# Patient Record
Sex: Female | Born: 1960 | Race: White | Hispanic: No | Marital: Married | Smoking: Never smoker
Health system: Southern US, Academic
[De-identification: ages and names within clinical notes are randomized; demographics above are authoritative.]

## PROBLEM LIST (undated history)

## (undated) DIAGNOSIS — E039 Hypothyroidism, unspecified: Secondary | ICD-10-CM

## (undated) DIAGNOSIS — E782 Mixed hyperlipidemia: Secondary | ICD-10-CM

## (undated) DIAGNOSIS — I10 Essential (primary) hypertension: Secondary | ICD-10-CM

## (undated) DIAGNOSIS — J45909 Unspecified asthma, uncomplicated: Secondary | ICD-10-CM

## (undated) DIAGNOSIS — F411 Generalized anxiety disorder: Secondary | ICD-10-CM

## (undated) HISTORY — DX: Generalized anxiety disorder: F41.1

## (undated) HISTORY — DX: Mixed hyperlipidemia: E78.2

## (undated) HISTORY — DX: Hypothyroidism, unspecified: E03.9

## (undated) HISTORY — DX: Unspecified asthma, uncomplicated: J45.909

## (undated) HISTORY — PX: HX APPENDECTOMY: SHX54

## (undated) HISTORY — DX: Essential (primary) hypertension: I10

---

## 1983-08-01 HISTORY — PX: HX PARTIAL HYSTERECTOMY: SHX80

## 1988-10-19 ENCOUNTER — Emergency Department (HOSPITAL_COMMUNITY): Payer: Self-pay

## 1993-07-31 HISTORY — PX: HX GALL BLADDER SURGERY/CHOLE: SHX55

## 2015-04-30 HISTORY — PX: THYROID SURGERY: SHX805

## 2020-07-31 HISTORY — PX: LEG SURGERY: SHX1003

## 2021-08-23 ENCOUNTER — Other Ambulatory Visit: Payer: Self-pay

## 2021-09-22 ENCOUNTER — Other Ambulatory Visit: Payer: Self-pay

## 2021-10-18 ENCOUNTER — Emergency Department (EMERGENCY_DEPARTMENT_HOSPITAL): Payer: No Typology Code available for payment source

## 2021-10-18 ENCOUNTER — Encounter (HOSPITAL_BASED_OUTPATIENT_CLINIC_OR_DEPARTMENT_OTHER): Payer: Self-pay

## 2021-10-18 ENCOUNTER — Emergency Department
Admission: EM | Admit: 2021-10-18 | Discharge: 2021-10-18 | Disposition: A | Discharge status: Home / Self Care | Payer: No Typology Code available for payment source | Attending: Family | Admitting: Family

## 2021-10-18 ENCOUNTER — Encounter (RURAL_HEALTH_CENTER): Payer: Self-pay | Admitting: Family

## 2021-10-18 ENCOUNTER — Other Ambulatory Visit: Payer: Self-pay

## 2021-10-18 ENCOUNTER — Ambulatory Visit (RURAL_HEALTH_CENTER): Payer: No Typology Code available for payment source | Attending: Family | Admitting: Family

## 2021-10-18 VITALS — BP 140/86 | HR 70 | Temp 97.5°F | Resp 18 | Ht 64.0 in | Wt 236.1 lb

## 2021-10-18 DIAGNOSIS — R531 Weakness: Secondary | ICD-10-CM | POA: Insufficient documentation

## 2021-10-18 DIAGNOSIS — M5432 Sciatica, left side: Secondary | ICD-10-CM

## 2021-10-18 DIAGNOSIS — M5416 Radiculopathy, lumbar region: Secondary | ICD-10-CM | POA: Insufficient documentation

## 2021-10-18 DIAGNOSIS — M5442 Lumbago with sciatica, left side: Secondary | ICD-10-CM | POA: Insufficient documentation

## 2021-10-18 DIAGNOSIS — R29898 Other symptoms and signs involving the musculoskeletal system: Secondary | ICD-10-CM

## 2021-10-18 DIAGNOSIS — M545 Low back pain, unspecified: Secondary | ICD-10-CM

## 2021-10-18 DIAGNOSIS — R29890 Loss of height: Secondary | ICD-10-CM | POA: Insufficient documentation

## 2021-10-18 DIAGNOSIS — M5136 Other intervertebral disc degeneration, lumbar region: Secondary | ICD-10-CM | POA: Insufficient documentation

## 2021-10-18 DIAGNOSIS — M549 Dorsalgia, unspecified: Secondary | ICD-10-CM

## 2021-10-18 MED ORDER — MELOXICAM 7.5 MG TABLET
7.5000 mg | ORAL_TABLET | Freq: Every day | ORAL | 0 refills | Status: AC
Start: 2021-10-18 — End: 2021-11-01

## 2021-10-18 MED ORDER — KETOROLAC 60 MG/2 ML INTRAMUSCULAR SOLUTION
INTRAMUSCULAR | Status: AC
Start: 2021-10-18 — End: 2021-10-18
  Filled 2021-10-18: qty 2

## 2021-10-18 MED ORDER — KETOROLAC 60 MG/2 ML INTRAMUSCULAR SOLUTION
60.0000 mg | INTRAMUSCULAR | Status: AC
Start: 2021-10-18 — End: 2021-10-18
  Administered 2021-10-18: 60 mg via INTRAMUSCULAR

## 2021-10-18 MED ORDER — METHYLPREDNISOLONE 4 MG TABLETS IN A DOSE PACK
ORAL_TABLET | ORAL | 0 refills | Status: DC
Start: 2021-10-18 — End: 2023-09-04

## 2021-10-18 MED ORDER — DEXAMETHASONE SODIUM PHOSPHATE (PF) 10 MG/ML INJECTION SOLUTION
10.0000 mg | INTRAMUSCULAR | Status: AC
Start: 2021-10-18 — End: 2021-10-18
  Administered 2021-10-18: 10 mg via INTRAMUSCULAR

## 2021-10-18 MED ORDER — LIDOCAINE 4 % TOPICAL PATCH
1.0000 | MEDICATED_PATCH | CUTANEOUS | Status: DC
Start: 2021-10-18 — End: 2021-10-18
  Administered 2021-10-18: 1 via TRANSDERMAL

## 2021-10-18 MED ORDER — CYCLOBENZAPRINE 10 MG TABLET
10.0000 mg | ORAL_TABLET | Freq: Three times a day (TID) | ORAL | 0 refills | Status: DC | PRN
Start: 2021-10-18 — End: 2023-09-04

## 2021-10-18 MED ORDER — METHOCARBAMOL 750 MG TABLET
1500.0000 mg | ORAL_TABLET | Freq: Once | ORAL | Status: AC
Start: 2021-10-18 — End: 2021-10-18
  Administered 2021-10-18: 1500 mg via ORAL

## 2021-10-18 MED ORDER — DEXAMETHASONE SODIUM PHOSPHATE (PF) 10 MG/ML INJECTION SOLUTION
INTRAMUSCULAR | Status: AC
Start: 2021-10-18 — End: 2021-10-18
  Filled 2021-10-18: qty 1

## 2021-10-18 MED ORDER — METHOCARBAMOL 750 MG TABLET
ORAL_TABLET | ORAL | Status: AC
Start: 2021-10-18 — End: 2021-10-18
  Filled 2021-10-18: qty 2

## 2021-10-18 MED ORDER — LIDOCAINE 4 % TOPICAL PATCH
MEDICATED_PATCH | CUTANEOUS | Status: AC
Start: 2021-10-18 — End: 2021-10-18
  Filled 2021-10-18: qty 1

## 2021-10-18 NOTE — ED Nurses Note (Signed)
Lidocaine patch in still on patient at time of discharge.

## 2021-10-18 NOTE — ED Provider Notes (Signed)
[facility], [facility] Emergency Department  ED Primary Provider Note  History of Present Illness   Chief Complaint   Patient presents with   . Back Pain     Lower Back pain, radiating to left leg     Arrival: The patient arrived by Car    Jennifer Tapia is a 61 y.o. female who had concerns including Back Pain. lower back pain into left hip thigh knee. States hx of ddd. No trauma. Occurred after bending over mopping. States no bowel or bladder changes. No imaging in the past year   Review of Systems   Constitutional: No fever, chills or weakness   Skin: No rash or diaphoresis  HENT: No headaches, or congestion  Eyes: No vision changes or photophobia   Cardio: No chest pain, palpitations or leg swelling   Respiratory: No cough, wheezing or SOB  GI:  No nausea, vomiting or stool changes  GU:  No dysuria, hematuria, or increased frequency  MSK: No muscle aches, + joint + back pain  Neuro: No seizures, LOC, numbness, tingling, or focal weakness  Psychiatric: No depression, SI or substance abuse  All other systems reviewed and are negative.    Historical Data   History Reviewed This Encounter: all noted and reviewed.   Physical Exam   ED Triage Vitals [10/18/21 1244]   BP (Non-Invasive) (!) 142/79   Heart Rate 62   Respiratory Rate (!) 8   Temperature 36.4 C (97.5 F)   SpO2 96 %   Weight 106 kg (234 lb)   Height 1.626 m (5\' 4" )       Constitutional:  61 y.o. female who appears in no distress. Normal color, no cyanosis.   HENT:   Head: Normocephalic and atraumatic.   Mouth/Throat: Oropharynx is clear and moist.   Eyes: EOMI, PERRL   Neck: Trachea midline. Neck supple.  Cardiovascular: RRR, No murmurs, rubs or gallops. Intact distal pulses.  Pulmonary/Chest: BS equal bilaterally. No respiratory distress. No wheezes, rales or chest tenderness.   Abdominal: Bowel sounds present and normal. Abdomen soft, no tenderness, no rebound and no guarding.  Back: + lower lumbar midline spinal  tenderness, + left  paraspinal tenderness, no CVA tenderness.           Musculoskeletal: No edema, tenderness or deformity.  Skin: warm and dry. No rash, erythema, pallor or cyanosis  Psychiatric: normal mood and affect. Behavior is normal.   Neurological: Patient keenly alert and responsive, easily able to raise eyebrows, facial muscles/expressions symmetric, speaking in fluent sentences, moving all extremities equally and fully, normal gait  Patient Data   Labs Ordered/Reviewed - No data to display  XR LUMBOSACRAL SPINE   Final Result by Edi, Radresults In (03/21 1406)   The included distal thoracic vertebral bodies and the 5 lumbar vertebral bodies appear normal in height.   A decrease in height of some of the lower thoracic intervertebral disc spaces   Decrease in height of several of the lumbar intervertebral disc spaces slightly more, at L4/L5 and L5/S1.   Mild hypertrophic degenerative bony spurring at several levels.    Suggestion of degenerative facet arthropathy in the lower lumbar spine.               Radiologist location ID: [place] Decision Making           mdm - sciatica ddd. cauda equina syndrome considered no saddle numbness.  Medications Administered in the ED   lidocaine 4% patch (1 Patch Transdermal Patch Applied 10/18/21 1338)   dexamethasone (PF) 10 mg/mL injection (10 mg IntraMUSCULAR Given 10/18/21 1341)   methocarbamol (ROBAXIN) tablet (1,500 mg Oral Given 10/18/21 1337)   ketorolac (TORADOL) 60mg /2 mL IM injection (60 mg IntraMUSCULAR Given 10/18/21 1342)     Clinical Impression   Sciatica, left side (Primary)   Back pain, unspecified back location, unspecified back pain laterality, unspecified chronicity   DDD (degenerative disc disease), lumbar       Disposition: Discharged

## 2021-10-18 NOTE — ED Triage Notes (Signed)
Patient states that Sunday the back pain began after cleaning house, the radiating leg pain gradually began.

## 2021-10-18 NOTE — Progress Notes (Signed)
FAMILY MEDICINE, [facility] FAM[facility] MEDICINE [facility]  [address]  [place] [address]  Operated by [facility]     Name: Jennifer Tapia MRN:  [id number]   Date of Birth: Sep 21, 1960 Age: 61 y.o.   Date: 10/18/2021  Time: 12:21     Provider: Asa Lente, FNP    Reason for visit: Lower Back Pain      History of Present Illness:  Jennifer Tapia is a 61 y.o. female present to clinic today with c/o back pain. The initial pain started across the lower back, and across the upper back, just below the shoulder blades but over yesterday and today has progressed down the L leg to the knee. This started Sunday night after mopping the floor. She denies any issue with bowel or bladder incontinence. But has not had a BM since this started.   She tells me she been taking some ibuprofen intermittently and she has taken 1 of her husbands mobics. She not getting any relief from the medication. It is hard for her to ambulate d/t pain. She is also requiring a cane to steady her balance.   She tells me from time to time she has a flare up that resolves in 1-2 days. "but nothing like this"  She does use a Chiropractor for these flares. The chiropractor told her she has issues at L3-L4, she says that from a Xray she had in the past.  She tells me she substained a injury as a child to the area of the back that is hurting.   Her primary care provider is Dr Opal Sidles, he is currently out of town.    Historical Data    Past Medical History:  Past Medical History:   Diagnosis Date   . Asthma    . Generalized anxiety disorder    . HTN (hypertension)    . Hypothyroidism    . Mixed hyperlipidemia      Past Surgical History:   Past Surgical History:   Procedure Laterality Date   . HX APPENDECTOMY     . HX CESAREAN SECTION  1984   . HX CHOLECYSTECTOMY  1995   . HX PARTIAL HYSTERECTOMY  1985   . LEG SURGERY  2022    Cancer of right leg   . THYROID SURGERY  04/30/2015     Allergies:  Allergies   Allergen Reactions   .  Avelox [Moxifloxacin] Itching   . Doxycycline Nausea/ Vomiting     Medications:  Current Outpatient Medications   Medication Sig   . budesonide-glycopyr-formoterol (BREZTRI AEROSPHERE) 160-9-4.8 mcg/actuation Inhalation HFA Aerosol Inhaler Take 2 Puffs by inhalation Twice per day as needed   . FLUoxetine (PROZAC) 20 mg Oral Capsule Take 1 Capsule (20 mg total) by mouth Once a day   . fluticasone propionate (FLOVENT) 110 mcg/actuation Inhalation HFA Aerosol Inhaler oral inhaler Take 2 Puffs by inhalation Twice daily   . latanoprost (XALATAN) 0.005 % Ophthalmic Drops Instill 1 Drop into both eyes Every night   . levothyroxine (SYNTHROID) 88 mcg Oral Tablet Take 1 Tablet (88 mcg total) by mouth Once a day   . montelukast (SINGULAIR) 10 mg Oral Tablet Take 1 Tablet (10 mg total) by mouth Once a day   . rosuvastatin (CRESTOR) 10 mg Oral Tablet Take 1 Tablet (10 mg total) by mouth Every evening   . timoloL maleate (TIMOPTIC) 0.5 % Ophthalmic Drops Instill 1 Drop into both eyes Once a day   . triamterene-hydroCHLOROthiazide (DYAZIDE) 37.5-25  mg Oral Capsule Take 1 Capsule by mouth Once a day     Family History:  Family Medical History:     Problem Relation (Age of Onset)    Diabetes type II Mother    Heart Disease Mother    Hypertension (High Blood Pressure) Mother, Father    Lung Cancer Father    Stroke Father, Brother          Social History:  Social History     Socioeconomic History   . Marital status: Married     Spouse name: Jonny Ruiz   . Number of children: 2   . Years of education: 12   Tobacco Use   . Smoking status: Never   . Smokeless tobacco: Never   Vaping Use   . Vaping Use: Never used   Substance and Sexual Activity   . Alcohol use: Never   . Drug use: Never   . Sexual activity: Not Currently     Partners: Male     Social Determinants of Health     Financial Resource Strain: Low Risk    . SDOH Financial: No   Transportation Needs: Low Risk    . SDOH Transportation: No   Social Connections: Low Risk    . SDOH  Social Isolation: 5 or more times a week   Intimate Partner Violence: Low Risk    . SDOH Domestic Violence: No   Housing Stability: Low Risk    . SDOH Housing Situation: I have housing.   Marland Kitchen SDOH Housing Worry: No           Review of Systems:  Any pertinent Review of Systems as addressed in the HPI above.    Physical Exam:  Vital Signs:  Vitals:    10/18/21 1119   BP: (!) 140/86   Pulse: 70   Resp: 18   Temp: 36.4 C (97.5 F)   TempSrc: Temporal   SpO2: 96%   Weight: 107 kg (236 lb 2 oz)   Height: 1.626 m (5\' 4" )   BMI: 40.62     Physical Exam  Constitutional:       General: She is in acute distress.      Appearance: She is obese.   Cardiovascular:      Rate and Rhythm: Normal rate and regular rhythm.      Pulses: Normal pulses.   Pulmonary:      Effort: Pulmonary effort is normal.   Abdominal:      Palpations: Abdomen is soft.   Musculoskeletal:      Cervical back: Normal and neck supple.      Thoracic back: Spasms, tenderness and bony tenderness present. No swelling or edema. Decreased range of motion. Scoliosis present.      Lumbar back: Spasms, tenderness and bony tenderness present. Decreased range of motion. Positive right straight leg raise test and positive left straight leg raise test.      Right lower leg: Tenderness present.      Right foot: No foot drop.      Left foot: No foot drop.   Neurological:      Mental Status: She is alert and oriented to person, place, and time.      Sensory: Sensation is intact.      Motor: No weakness (R lower leg weakness).      Gait: Gait abnormal.      Deep Tendon Reflexes: Reflexes abnormal.      Reflex Scores:       Patellar  reflexes are 1+ on the right side and 2+ on the left side.       Achilles reflexes are 1+ on the right side and 2+ on the left side.         Assessment and Plan:    ICD-10-CM    1. Lumbar radiculopathy, acute  M54.16       2. Weakness of right leg  R29.898          It is quite obvious that [name] is having acute issues with Radiculopathy.  At this  time, due to the weakness of her R leg with reduced DTR, I believe it is in her best interest to go to the ER for further evaluation and scans. I have explained my findings and concerns to her and her husband who is with her. They have agreed to go.     Differential to r/o immediately Cauda equina syndrome.    Sekou Zuckerman L Berta Denson, FNP     Portions of this note may be dictated using voice recognition software or a dictation service. Variances in spelling and vocabulary are possible and unintentional. Not all errors are caught/corrected. Please notify the [facility] if any discrepancies are noted or if the meaning of any statement is not clear.

## 2021-10-18 NOTE — Nursing Note (Signed)
Patient here today with lower back pain. Patient states that she was mopping and sweeping on Sunday and that is when the pain started. Pain states that the pain is moving down to her legs and up into her shoulders.

## 2021-10-18 NOTE — Discharge Instructions (Signed)
Recommend mri and possible physical therapy.

## 2021-11-24 ENCOUNTER — Other Ambulatory Visit: Payer: Self-pay

## 2021-11-24 MED ORDER — LATANOPROST 0.005 % EYE DROPS
1.0000 [drp] | Freq: Every evening | OPHTHALMIC | 4 refills | Status: DC
Start: 2021-11-24 — End: 2023-09-04
  Filled 2021-11-24: qty 7.5, 75d supply, fill #0
  Filled 2022-02-13: qty 7.5, 75d supply, fill #1
  Filled 2022-04-13: qty 2.5, 25d supply, fill #2
  Filled 2022-04-17: qty 5, 50d supply, fill #2
  Filled 2022-06-19: qty 7.5, 75d supply, fill #3
  Filled 2022-07-14 – 2022-07-21 (×4): qty 7.5, 75d supply, fill #0
  Filled 2022-08-13: qty 2.5, 25d supply, fill #0
  Filled 2022-08-25: qty 5, 50d supply, fill #0
  Filled 2022-10-15 – 2022-11-01 (×2): qty 7.5, 75d supply, fill #1
  Filled ????-??-??: fill #0

## 2021-11-24 MED ORDER — TIMOLOL MALEATE 0.5 % ONCE DAILY EYE DROPS
1.0000 [drp] | Freq: Every morning | OPHTHALMIC | 4 refills | Status: DC
Start: 2021-11-24 — End: 2023-09-04
  Filled 2021-11-24: qty 7.5, 75d supply, fill #0
  Filled 2022-02-13 – 2022-04-13 (×2): qty 7.5, 75d supply, fill #1
  Filled 2022-06-19: qty 7.5, 75d supply, fill #2
  Filled 2022-07-14: qty 7.5, 75d supply, fill #0

## 2021-11-25 ENCOUNTER — Other Ambulatory Visit: Payer: Self-pay

## 2021-11-25 MED ORDER — TRIAMTERENE 37.5 MG-HYDROCHLOROTHIAZIDE 25 MG CAPSULE
ORAL_CAPSULE | ORAL | 5 refills | Status: DC
Start: 2021-11-25 — End: 2023-09-04
  Filled 2021-11-25: qty 90, 90d supply, fill #0
  Filled 2022-02-28: qty 90, 90d supply, fill #1
  Filled 2022-05-29: qty 90, 90d supply, fill #2
  Filled 2022-07-14 – 2022-08-16 (×2): qty 90, 90d supply, fill #0
  Filled 2022-11-21: qty 90, 90d supply, fill #1

## 2021-11-25 MED ORDER — FLUOXETINE 20 MG CAPSULE
ORAL_CAPSULE | ORAL | 5 refills | Status: DC
Start: 2021-11-25 — End: 2023-09-04
  Filled 2021-11-25: qty 90, 90d supply, fill #0
  Filled 2022-02-23: qty 90, 90d supply, fill #1
  Filled 2022-05-24: qty 90, 90d supply, fill #2
  Filled 2022-07-14 – 2022-08-16 (×2): qty 90, 90d supply, fill #0
  Filled 2022-11-21: qty 90, 90d supply, fill #1

## 2021-11-25 MED ORDER — LEVOTHYROXINE 88 MCG TABLET
ORAL_TABLET | ORAL | 5 refills | Status: DC
Start: 2021-11-25 — End: 2023-09-04
  Filled 2021-11-25 – 2021-11-30 (×2): qty 90, 90d supply, fill #0
  Filled 2022-02-28: qty 90, 90d supply, fill #1
  Filled 2022-05-29: qty 90, 90d supply, fill #2
  Filled 2022-07-14 – 2022-08-16 (×2): qty 90, 90d supply, fill #0
  Filled 2022-11-21: qty 90, 90d supply, fill #1

## 2021-11-25 MED ORDER — ROSUVASTATIN 20 MG TABLET
ORAL_TABLET | ORAL | 5 refills | Status: DC
Start: 2021-11-25 — End: 2021-12-06
  Filled 2021-11-25 – 2021-12-02 (×2): qty 90, 90d supply, fill #0

## 2021-11-25 MED ORDER — MONTELUKAST 10 MG TABLET
ORAL_TABLET | ORAL | 5 refills | Status: DC
Start: 2021-11-25 — End: 2023-09-04
  Filled 2021-11-25: qty 90, 90d supply, fill #0
  Filled 2022-02-23: qty 90, 90d supply, fill #1
  Filled 2022-05-24: qty 90, 90d supply, fill #2
  Filled 2022-07-14 – 2022-08-16 (×2): qty 90, 90d supply, fill #0
  Filled 2022-11-21: qty 90, 90d supply, fill #1

## 2021-11-25 MED ORDER — FLUTICASONE PROPIONATE 110 MCG/ACTUATION HFA AEROSOL INHALER
INHALATION_SPRAY | RESPIRATORY_TRACT | 5 refills | Status: DC
Start: 2021-11-25 — End: 2023-09-04
  Filled 2021-11-25 – 2022-11-21 (×3): qty 12, 30d supply, fill #0

## 2021-11-25 MED ORDER — FAMOTIDINE 40 MG TABLET
ORAL_TABLET | ORAL | 5 refills | Status: AC
Start: 2021-11-25 — End: ?
  Filled 2021-11-25: qty 90, 90d supply, fill #0
  Filled 2022-02-23: qty 90, 90d supply, fill #1
  Filled 2022-05-24: qty 90, 90d supply, fill #2
  Filled 2022-07-14 – 2022-08-16 (×2): qty 90, 90d supply, fill #0
  Filled 2022-11-21: qty 90, 90d supply, fill #1

## 2021-11-28 ENCOUNTER — Other Ambulatory Visit: Payer: Self-pay

## 2021-11-29 ENCOUNTER — Other Ambulatory Visit: Payer: Self-pay

## 2021-11-30 ENCOUNTER — Other Ambulatory Visit: Payer: Self-pay

## 2021-12-01 ENCOUNTER — Other Ambulatory Visit: Payer: Self-pay

## 2021-12-02 ENCOUNTER — Other Ambulatory Visit: Payer: Self-pay

## 2021-12-05 ENCOUNTER — Other Ambulatory Visit: Payer: Self-pay

## 2021-12-06 ENCOUNTER — Other Ambulatory Visit: Payer: Self-pay

## 2021-12-06 MED ORDER — ROSUVASTATIN 10 MG TABLET
10.0000 mg | ORAL_TABLET | Freq: Every day | ORAL | 5 refills | Status: DC
Start: 2021-12-06 — End: 2023-02-14
  Filled 2021-12-14 – 2022-02-10 (×4): qty 90, 90d supply, fill #0
  Filled 2022-05-17: qty 60, 60d supply, fill #1
  Filled 2022-05-19: qty 30, 30d supply, fill #1
  Filled 2022-07-14 – 2022-08-15 (×2): qty 90, 90d supply, fill #0
  Filled 2022-11-16: qty 90, 90d supply, fill #1

## 2021-12-14 ENCOUNTER — Other Ambulatory Visit: Payer: Self-pay

## 2021-12-28 ENCOUNTER — Other Ambulatory Visit: Payer: Self-pay

## 2021-12-29 ENCOUNTER — Other Ambulatory Visit: Payer: Self-pay

## 2021-12-30 ENCOUNTER — Other Ambulatory Visit: Payer: Self-pay

## 2021-12-30 MED ORDER — WEGOVY 0.25 MG/0.5 ML SUBCUTANEOUS PEN INJECTOR
0.2500 mg | PEN_INJECTOR | SUBCUTANEOUS | 5 refills | Status: DC
Start: 2021-12-30 — End: 2022-03-29
  Filled 2021-12-30: qty 2, 28d supply, fill #0

## 2022-01-03 ENCOUNTER — Other Ambulatory Visit: Payer: Self-pay

## 2022-01-06 ENCOUNTER — Other Ambulatory Visit: Payer: Self-pay

## 2022-01-11 ENCOUNTER — Other Ambulatory Visit: Payer: Self-pay

## 2022-01-13 ENCOUNTER — Other Ambulatory Visit: Payer: Self-pay

## 2022-01-17 ENCOUNTER — Other Ambulatory Visit: Payer: Self-pay

## 2022-01-23 ENCOUNTER — Encounter (INDEPENDENT_AMBULATORY_CARE_PROVIDER_SITE_OTHER): Payer: No Typology Code available for payment source | Admitting: Surgery

## 2022-02-09 ENCOUNTER — Other Ambulatory Visit: Payer: Self-pay

## 2022-02-10 ENCOUNTER — Other Ambulatory Visit: Payer: Self-pay

## 2022-02-13 ENCOUNTER — Other Ambulatory Visit: Payer: Self-pay

## 2022-02-20 ENCOUNTER — Other Ambulatory Visit: Payer: Self-pay

## 2022-02-20 ENCOUNTER — Encounter (INDEPENDENT_AMBULATORY_CARE_PROVIDER_SITE_OTHER): Payer: Self-pay | Admitting: Surgery

## 2022-02-21 ENCOUNTER — Other Ambulatory Visit: Payer: Self-pay

## 2022-02-28 ENCOUNTER — Other Ambulatory Visit: Payer: Self-pay

## 2022-03-03 ENCOUNTER — Other Ambulatory Visit: Payer: Self-pay

## 2022-03-29 ENCOUNTER — Encounter (INDEPENDENT_AMBULATORY_CARE_PROVIDER_SITE_OTHER): Payer: Self-pay | Admitting: Surgery

## 2022-03-29 ENCOUNTER — Other Ambulatory Visit: Payer: Self-pay

## 2022-03-29 ENCOUNTER — Ambulatory Visit (INDEPENDENT_AMBULATORY_CARE_PROVIDER_SITE_OTHER): Payer: No Typology Code available for payment source | Admitting: Surgery

## 2022-03-29 VITALS — BP 147/78 | HR 66 | Ht 64.0 in | Wt 234.0 lb

## 2022-03-29 DIAGNOSIS — L989 Disorder of the skin and subcutaneous tissue, unspecified: Secondary | ICD-10-CM

## 2022-03-29 NOTE — Progress Notes (Signed)
GENERAL SURGERY, [facility]  [address]  [place] [address]  Phone: [phone]  Fax: [phone]    Encounter Date: 03/29/2022    Patient ID:  Jennifer Tapia  JJO:[id number]    DOB: [birth year]  Age: 61 y.o. female    Subjective:     Chief Complaint   Patient presents with    Mole - Changing     Black mole on back it has been there a while and it is changing      HPI:   Patient claimed that she had lesion on the left upper back area that has changed is causing some itching and wanted to have it checked.  She has a history of melanoma that made her more concerned about it.    Current Outpatient Medications   Medication Sig    budesonide-glycopyr-formoterol (BREZTRI AEROSPHERE) 160-9-4.8 mcg/actuation Inhalation HFA Aerosol Inhaler Take 2 Puffs by inhalation Twice per day as needed    cyclobenzaprine (FLEXERIL) 10 mg Oral Tablet Take 1 Tablet (10 mg total) by mouth Three times a day as needed for Muscle spasms    famotidine (PEPCID) 40 mg Oral Tablet Take 1 tablet (40 mg) by mouth once daily    FLUoxetine (PROZAC) 20 mg Oral Capsule Take 1 Capsule (20 mg total) by mouth Once a day    FLUoxetine (PROZAC) 20 mg Oral Capsule Take 1 capsule (20mg ) by mouth once daily    fluticasone propionate (FLOVENT HFA) 110 mcg/actuation Inhalation HFA Aerosol Inhaler oral inhaler Inhale 2 puffs by mouth twice daily    fluticasone propionate (FLOVENT) 110 mcg/actuation Inhalation HFA Aerosol Inhaler oral inhaler Take 2 Puffs by inhalation Twice daily    latanoprost (XALATAN) 0.005 % Ophthalmic Drops Instill 1 Drop into both eyes Every night    latanoprost (XALATAN) 0.005 % Ophthalmic Drops Instill 1 Drop into both eyes Every evening    levothyroxine (SYNTHROID) 88 mcg Oral Tablet Take 1 Tablet (88 mcg total) by mouth Once a day    levothyroxine (SYNTHROID) 88 mcg Oral Tablet Take 1 tablet (88 mcg) by mouth once daily    Methylprednisolone (MEDROL DOSEPACK) 4 mg Oral Tablets, Dose Pack Take as instructed.     montelukast (SINGULAIR) 10 mg Oral Tablet Take 1 Tablet (10 mg total) by mouth Once a day    montelukast (SINGULAIR) 10 mg Oral Tablet Take 1 tablet (10 mg) by mouth every night at bedtime    rosuvastatin (CRESTOR) 10 mg Oral Tablet Take 1 Tablet (10 mg total) by mouth Once a day    semaglutide, weight loss, (WEGOVY) 0.25 mg/0.5 mL Subcutaneous Pen Injector Inject 0.5 mL (0.25 mg total) under the skin Every 7 days    timolol maleate (ISTALOL) 0.5 % Ophthalmic Drops, Once Daily Instill 1 Drop into both eyes Every morning    timoloL maleate (TIMOPTIC) 0.5 % Ophthalmic Drops Instill 1 Drop into both eyes Once a day    triamterene-hydroCHLOROthiazide (DYAZIDE) 37.5-25 mg Oral Capsule Take 1 Capsule by mouth Once a day    triamterene-hydroCHLOROthiazide (DYAZIDE) 37.5-25 mg Oral Capsule Take 1 capsule by mouth once daily     Allergies   Allergen Reactions    Doxycycline Nausea/ Vomiting    Amoxicillin      itching, rash    Lansoprazole      rash    Pantoprazole      rash    Sulfa (Sulfonamides)      rash    Avelox [Moxifloxacin] Itching     Past Medical  History:   Diagnosis Date    Asthma     Generalized anxiety disorder     HTN (hypertension)     Hypothyroidism     Mixed hyperlipidemia          Past Surgical History:   Procedure Laterality Date    HX APPENDECTOMY      HX CESAREAN SECTION  1984    HX CHOLECYSTECTOMY  1995    HX PARTIAL HYSTERECTOMY  1985    LEG SURGERY  2022    Cancer of right leg    THYROID SURGERY  04/30/2015         Family Medical History:       Problem Relation (Age of Onset)    Diabetes type II Mother    Heart Disease Mother    Hypertension (High Blood Pressure) Mother, Father    Lung Cancer Father    Stroke Father, Brother            Social History     Tobacco Use    Smoking status: Never    Smokeless tobacco: Never   Vaping Use    Vaping Use: Never used   Substance Use Topics    Alcohol use: Never    Drug use: Never       ROS:   Unremarkable  Objective:   Vitals: BP (!) 147/78 (Site: Left, Patient  Position: Sitting, Cuff Size: Adult)   Pulse 66   Ht 1.626 m (5\' 4" )   Wt 106 kg (234 lb)   SpO2 95%   BMI 40.17 kg/m         Physical Exam:  Left upper back in the scapular area has about 5 mm seborrheic keratosis type lesion and I did not see any suspicious lesions for malignancy.    Assessment & Plan:     ENCOUNTER DIAGNOSES     ICD-10-CM   1. Skin lesion of back  L98.9   I told the patient that this is benign lesion in my opinion however she should continue her appointment with a dermatologist on Friday for their opinion.    No orders of the defined types were placed in this encounter.      No follow-ups on file.    Wednesday, MD

## 2022-04-13 ENCOUNTER — Other Ambulatory Visit: Payer: Self-pay

## 2022-04-14 ENCOUNTER — Other Ambulatory Visit: Payer: Self-pay

## 2022-04-17 ENCOUNTER — Other Ambulatory Visit: Payer: Self-pay

## 2022-04-18 ENCOUNTER — Other Ambulatory Visit: Payer: Self-pay

## 2022-04-19 ENCOUNTER — Other Ambulatory Visit: Payer: Self-pay

## 2022-04-26 ENCOUNTER — Other Ambulatory Visit: Payer: Self-pay

## 2022-05-09 ENCOUNTER — Other Ambulatory Visit: Payer: Self-pay

## 2022-05-11 ENCOUNTER — Other Ambulatory Visit: Payer: Self-pay

## 2022-05-17 ENCOUNTER — Other Ambulatory Visit: Payer: Self-pay

## 2022-05-18 ENCOUNTER — Other Ambulatory Visit: Payer: Self-pay

## 2022-05-19 ENCOUNTER — Other Ambulatory Visit: Payer: Self-pay

## 2022-05-29 ENCOUNTER — Other Ambulatory Visit: Payer: Self-pay

## 2022-05-30 ENCOUNTER — Other Ambulatory Visit: Payer: Self-pay

## 2022-06-01 ENCOUNTER — Other Ambulatory Visit: Payer: Self-pay

## 2022-06-02 ENCOUNTER — Other Ambulatory Visit: Payer: Self-pay

## 2022-06-06 ENCOUNTER — Other Ambulatory Visit: Payer: Self-pay

## 2022-06-12 ENCOUNTER — Other Ambulatory Visit: Payer: Self-pay

## 2022-06-14 ENCOUNTER — Other Ambulatory Visit: Payer: Self-pay

## 2022-06-15 ENCOUNTER — Other Ambulatory Visit: Payer: Self-pay

## 2022-06-19 ENCOUNTER — Other Ambulatory Visit: Payer: Self-pay

## 2022-06-29 ENCOUNTER — Other Ambulatory Visit: Payer: Self-pay

## 2022-07-07 ENCOUNTER — Other Ambulatory Visit: Payer: Self-pay

## 2022-07-10 ENCOUNTER — Other Ambulatory Visit: Payer: Self-pay

## 2022-07-10 MED ORDER — LATANOPROST 0.005 % EYE DROPS
OPHTHALMIC | 0 refills | Status: DC
Start: 2022-07-07 — End: 2023-09-04
  Filled 2022-07-10 – 2022-08-28 (×4): qty 5, 50d supply, fill #0
  Filled 2022-08-29: qty 15, fill #0
  Filled 2022-11-14: qty 5, 50d supply, fill #0
  Filled 2022-12-24: qty 7.5, 75d supply, fill #0

## 2022-07-10 MED ORDER — TIMOLOL MALEATE 0.5 % EYE DROPS
OPHTHALMIC | 3 refills | Status: DC
Start: 2022-07-07 — End: 2023-09-04
  Filled 2022-07-10 – 2022-07-14 (×2): qty 5, 50d supply, fill #0
  Filled 2022-07-15: qty 5, 30d supply, fill #0
  Filled 2022-08-21: qty 5, 30d supply, fill #1
  Filled 2022-09-18: qty 5, 30d supply, fill #2
  Filled 2022-10-18: qty 5, 30d supply, fill #3

## 2022-07-11 ENCOUNTER — Other Ambulatory Visit: Payer: Self-pay

## 2022-07-13 ENCOUNTER — Other Ambulatory Visit: Payer: Self-pay

## 2022-07-14 ENCOUNTER — Other Ambulatory Visit: Payer: Self-pay

## 2022-07-15 ENCOUNTER — Other Ambulatory Visit: Payer: Self-pay

## 2022-07-17 ENCOUNTER — Other Ambulatory Visit: Payer: Self-pay

## 2022-07-19 ENCOUNTER — Other Ambulatory Visit: Payer: Self-pay

## 2022-07-20 ENCOUNTER — Other Ambulatory Visit: Payer: Self-pay

## 2022-07-21 ENCOUNTER — Other Ambulatory Visit: Payer: Self-pay

## 2022-07-25 ENCOUNTER — Other Ambulatory Visit: Payer: Self-pay

## 2022-07-26 ENCOUNTER — Other Ambulatory Visit: Payer: Self-pay

## 2022-07-27 ENCOUNTER — Other Ambulatory Visit: Payer: Self-pay

## 2022-08-11 ENCOUNTER — Encounter (HOSPITAL_COMMUNITY): Payer: Self-pay

## 2022-08-14 ENCOUNTER — Other Ambulatory Visit: Payer: Self-pay

## 2022-08-15 ENCOUNTER — Other Ambulatory Visit: Payer: Self-pay

## 2022-08-16 ENCOUNTER — Other Ambulatory Visit: Payer: Self-pay

## 2022-08-21 ENCOUNTER — Other Ambulatory Visit: Payer: Self-pay

## 2022-08-22 ENCOUNTER — Other Ambulatory Visit: Payer: Self-pay

## 2022-08-23 ENCOUNTER — Other Ambulatory Visit: Payer: Self-pay

## 2022-08-24 ENCOUNTER — Other Ambulatory Visit: Payer: Self-pay

## 2022-08-25 ENCOUNTER — Other Ambulatory Visit: Payer: Self-pay

## 2022-08-28 ENCOUNTER — Other Ambulatory Visit: Payer: Self-pay

## 2022-08-29 ENCOUNTER — Other Ambulatory Visit: Payer: Self-pay

## 2022-09-06 ENCOUNTER — Other Ambulatory Visit: Payer: Self-pay

## 2022-09-19 ENCOUNTER — Other Ambulatory Visit: Payer: Self-pay

## 2022-10-17 ENCOUNTER — Other Ambulatory Visit: Payer: Self-pay

## 2022-10-18 ENCOUNTER — Other Ambulatory Visit: Payer: Self-pay

## 2022-10-20 NOTE — Consults (Signed)
**Physician Signature**  This document was electronically signed by: Lauraine Rinne DO      **Consult Cover Page**  FROM: [place], [phone]  Call Back Number: [phone]  SUBJECT: Consult Recommendations  Date and Time of Report: 10/20/2022 06:32 PM ET  Items Contained in this Document: Neurology Consult Note    **Consult Information**  Member Facility: [facility] MRN: [id number]  Visit/Encounter Number: [id number]  Consult ID: [id number]  Facility Time Zone: ET  Date and Time of Request: 10-20-2022 05:11 PM ET  Requesting Clinician: patel,sheila  Patient Name: Jennifer Tapia, Jennifer Tapia  Date of Birth: 1960/09/16  Gender: Female  Patient identity was confirmed at the beginning of the consult with the patient/family/staff using two personal identifiers: Patient name and DOB    **Reason for Consult**  Reason for Consult: Code Stroke TLKW 4.5 to 24 hours    **General**  Chief Complaint: numbness  Patient Location and Admission Status: ED- Patient is not admitted  Family Members and Medical Staff Present During Exam: Husband. Santiago Glad RN  History of Present Illness: Ms Daulton said she started getting numbness and/or tingling on both sides of her chin. She is sure it was both sides. That was about 0900. It continued throughout the day. When she was coming home this evening it moved to both jaws. The left s de has resolved. She did have Bell's Palsy 30 years ago, but that was different. No numbness or tingling anywhere in her body. Her R UE became heavy around 1300. Weakness not due to joint/muscle pain. No pain in chin or jaw. No pain with chewing. No vision or hearing changes, other than vision "being fuzzy". No CP.  She has palpitations, but states that is standard for her. She does have dyspnea. No Abd pain, bowel or bladder concerns. No recent med changes. No recent illnesses. No fevers/chills. She denies any problems with anxiety. Currently it is mostly tingling in the right  chin, numbness in the right cheek and weakness in the right arm  Number of Documented HPI Elements: 4+    **Medical History**  Medical History: Hyperlipidemia, Hypertension, Hypothyroidism  Other Medical History: GERD. Glaucoma  Other Past Procedures: hemi thyroidectomy. melanoma excision. C-section x 2. Appy  Pertinent Family History: Stroke    **Allergies**  Other Allergies: avelox, doxy    **Medications**  Anti-Coagulants: None  Anti-Platelets: None  Other Medications: synthroid, crestor, flovent, latanoprost, prozac, singulair, traimeterne    **Social History**  Alcohol Use: None  Drug Use: None  Tobacco Use: None    **Vital Signs**  Temperature: Afebrile  Blood Pressure (mmHg): 135/74  Heart Rate (bpm): 65  Respiration Rate (/min): 18  O2 Sat (%): 96  POC Glucose(mg/dL): 121  Oxygen Delivery Method: Room Air  EKG Rhythm: Sinus Rhythm  Date and Time: 10/20/2022 06:06:04 PM  ET  POC Glucose(mg/dL): 121    **Review Of Systems**  General, Constitutional: See HPI  Neurological: See HPI  Psychiatric: See HPI  Cardiovascular: See HPI  Ears, Nose, Throat: See HPI  Respiratory: See HPI  Gastrointestinal: See HPI  Genitourinary: See HPI  Musculoskeletal: See HPI  Ophthalmology: See HPI    **NIH Stroke Scale**  NIH Stroke Scale Score: 0  1. Level of Consciousness: 0 : alert; keenly responsive.  1a. LOC Questions: 0 : Answers both questions correctly.   1b. LOC Commands: 0 : Performs both tasks correctly.  2. Best Gaze: 0 :  Normal.  3. Visual: 0 : No visual loss.  4. Facial Palsy: 0 : Normal symmetrical movements.  5a. Motor Left Arm: 0 : No drift; limb holds 90 (or 45) degrees for full 10 seconds.  5b. Motor Right Arm : 0 : No drift; limb holds 90 (or 45) degrees for full 10 seconds.  6a. Motor Left Leg: 0 :  No drift; leg holds 30-degree position for full 5 seconds.  6b. Motor Right Leg: 0 :  No drift; leg holds 30-degree position for full 5 seconds.  7. Limb Ataxia: 0 : Absent.  8. Sensory: 0 : Normal; no sensory  loss.  9. Best Language: 0 : No aphasia; normal.  10. Dysarthria: 0 : Normal.  11. Extinction and inattention (formerly Neglect) : 0 : No abnormality.  NIHSS Entry Time: 10/20/2022 06:19:55 PM  ET    **Exam**  Exam: -General: Obese 45 F in NAD    -MS: A&Ox4. Language fluent. Follows all simple commands    -CN: PERRL. VFF. EOMI. Facies w symmetric sensation and strength. Tongue midline. No dysarthria.    -Motor: No drift x 4 extremities    -Sensory: Symmetric to LT x 4 distal extremities. No extinction    -Coordination: Accurate FTN and HTS B/L  Clinician assisting with exam: Santiago Glad RN    **Labs and Imaging**  Labs available?: Yes  PT (Seconds): 10.5  INR: 1  PTT (Seconds): 27.5  Blood Glucose (mg/dL): 121  WBC (mcL): 9.5  HGB (g/dL): 13.5  HCT (%): 41.8  PLT (mcL): 295  Troponin (ng/mL): 6  Na (mEq/L): 144  K (mEq/L): 3.3  BUN (mg/dL): 17  Cr (mg/dL): 1.03  CT Brain Findings: No acute changes  Additional CT Image Types Reviewed: CTA  Other CT Findings : Per radiology report. No LVO or critical stenosis.  Please note, I was unable to review images myself. Images not viewable in EMR. Images not sent to Access system despite request. [facility] not responding to my request to contact [facility] radiology    **Assessment and Recommendations**  Assessment: Ms Macy presents after some bilateral lower facial sensory symptoms, which later changed into predominantly R facial and UE symptoms. The limited neurologic exam possible in this format is unremarkable with NIHSS of zero. However, she does reports ongoing facial sensory symptoms. The etiology is unclear. Bilaterally symmetric facial symptoms are unlikely to be a stroke, but left sided symptoms have resolved. She should be admitted for further evaluation. She is not a thrombolysis candidate based on time of onset and NIHSS is zero. It could be a migrainous phenomenon.  Recommendations: -Admit stroke/telemetry unit  -Permissive HTN to maximum SBP 220 and maximum DBP 110  (but do not lower precipitously)  -MRI Brain without contrast  -TTE w bubble  -ASA 325 mg x 1 now and then 81 mg daily   -Statin  -Lipid panel, HA1C, CBC, CMP, Coag, TSH, UA, B12, ESR, CRP  -NPO until bedside swallow  -PT, OT evals  -No triptans    This Teleneurology Consultation is provided in order to facilitate patient care in the absence of an immediately available in-person neurologist. The teleneurologist is not available on-site, does not give/write orders, and is not responsible for ongoing patient care/management. Any questions, orders, test results, or changes in patient status should be addressed directly with the on-site Physician of Record, and not the teleneurologist. However, please reconsult [facility] if we can be of any further assistance.  Disposition: Admit patient to telemetry or stroke unit    **Diagnosis**  Impression: Other  Diagnosis  Other: tingling, numbness  Case discussed with: Dr Posey Pronto    **Inclusion Criteria**  Time Last Known Well: 10-20-2022 09:00 AM ET  Neurological deficit considered to be disabling within 4.5 h of ischemic stroke symptom onset or patient last known well: No    **Thrombolysis Recommendation**  Thrombolysis recommended?: No  Reason Thrombolysis not recommended: Outside of window, Stroke severity too mild (non-disabling)    **ICD-10 Code**  ICD-10 Code (Primary): R20.2 : Paresthesia of skin  ICD-10 Code: R20.0 : Anesthesia of skin  ICD-10 Code: R29.700 : NIHSS score 0    **Attestation**  Interaction Mode: Video & Phone  Time of Phone Call : 10-20-2022 05:50 PM  ET  Time of Video Call : 10-20-2022 05:55 PM  ET  Interaction Attestation: Clinical telemedicine services delivered using HIPAA-compliant interactive video-audio telecommunications while the patient and the rendering provider were not in the same physical location. Written report was provided to the requesting provider.  Evaluation Duration (mins): 42    **Key Timer Summary**  ED Arrival Date and Time:  10-20-2022 02:21 PM  ET  Date and Time of Request: 10-20-2022 05:11 PM  ET    **Physician Signature**  This document was electronically signed by: Lauraine Rinne DO

## 2022-10-21 NOTE — H&P (Signed)
-------------------------------------------------------------------------------  Attestation signed by Fraser Din, MD at 10/21/2022  9:19 PM  This patient was seen and evaluated by Golden Circle, PA under my direct supervision and I was the [facility] Physician. This includes discussion of the plan of care for this patient, independent examination and a substantive portion of the visit completed by me, the physician. The patient's discharge evaluation, including the discharge exam and discharge plan, were reviewed and I agree with it. I personally made/approved the management plan and took responsibility for the patient management. The patient was personally seen by me and the substantive portion I performed was more than half of the [place].    -------------------------------------------------------------------------------    History & Physical  10/21/2022     PCP: Ellwood Dense     Chief Complaint:   Chief Complaint   Patient presents with   . STROKE ALERT   . Numbness     Chin and jaw   . Dizziness   . Headache   . Blurred Vision   . Arm Pain     Arm heaviness       History of Present Illness:  Jennifer Tapia is a 62 y.o. female with PMHX of asthma, HTN/ Hyperlipidemia, GERD, and Hypothyroidism who presents with a chief complaint of numb/tingling sensation.  Pt reports initial symptoms started in her lower extremities but quickly moved and changed more localized to her face, neck, and upper chest.  Reports she is unsure if they are related - and has hx of bells palsy - but that this was very different in nature.  She reports numbness and/or tingling on both sides of her chin and neck that continued throughout most of the day. Then later - when she was coming home this evening it moved to both jaws. Apparently by time of arrival the left sided facial numbness had improved. But the right was still there.  No numbness or tingling anywhere else in her body since it started in her face/  chin/neck but she began to feel weakness/ heaviness in her right arm.  No pain in chin or jaw. No pain with chewing. No vision or hearing changes, other than vision "being fuzzy". No CP , SOB, Abd pain, or bowel or bladder concerns. No recent medication  changes. No recent viral illnesses. No fevers/chills.     In the ED - VSS.  Basic labs eseeentially WNL except mild hypokalemia.  Head CT and other radiology studies without findings.  Tele Neuro consulted - appreciaete the assistance.  Pt admitted to observation to complete TIA work up and continued tx        Review of Systems  Review of Systems -  Constitutional: Negative for chills, fatigue and fever.   HENT: Negative for congestion, ear pain and sore throat.    Eyes: Negative for photophobia and visual disturbance.   Respiratory: Negative for shortness of breath, cough, and congestion.    Cardiovascular: Negative for chest pain and lpalpitations   Gastrointestinal: Negative for abdominal pain, diarrhea, nausea and vomiting.     Genitourinary: Negative for difficulty urinating, dysuria and flank pain.   Musculoskeletal: Positive heavy/ weak arm.  Negative for back pain and myalgias.   Skin: Negative for color change, pallor, rash and wound.   Neurological: Positive numbness and tingling in her face.  Negative for speech difficulty, seizures, strokes, syncope, and weakness.   Psychiatric/Behavioral: Negative.  Negative for self-injury and SI/HI.       The remainder of  the constitutional, HEENT, pulmonary, cardiovascular, genitourinary, GI, musculoskeletal, endocrine, neurologic and dermatologic systems are all negative.        Past Medical & Surgical History:   Past Medical History:   Diagnosis Date   . Asthma    . Essential hypertension, benign    . GERD (gastroesophageal reflux disease)    . High cholesterol    . Hypertension    . Hypothyroidism    . Melanoma [facility])        Past Surgical History:   Procedure Laterality Date   . EXPLORATORY OF ABDOMEN     . HX  APPENDECTOMY     . HX CESAREAN SECTION     . HX CHOLECYSTECTOMY     . HX HYSTERECTOMY     . THYROIDECTOMY         Allergies:  Allergies   Allergen Reactions   . Strawberries [Strawberry] Anaphylaxis   . Amoxil [Amoxicillin] Rash and Itching   . Doxycycline Nausea and Vomiting   . Avelox [Moxifloxacin] Rash   . Prevacid [Lansoprazole] Rash   . Protonix [Pantoprazole] Rash   . Sulfa (Sulfonamide Antibiotics) Rash       Medications:  Prior to Admission medications    Medication Sig Start Date End Date Taking? Authorizing Provider   albuterol (PROVENTIL HFA) 90 mcg/actuation HFA Aerosol Inhaler take 2 puffs every 4 hours as needed by inhalation .   Yes Emergency, Nurse, RN   levothyroxine (SYNTHROID) 88 mcg Tablet take 88 mcg every day by mouth .   Yes Emergency, Nurse, RN   latanoprost (XALATAN) 0.005 % Drops Apply 1 drop every evening each eye .   Yes Emergency, Nurse, RN   montelukast (SINGULAIR) 10 mg Tablet take 10 mg every day by mouth .   Yes Emergency, Nurse, RN   timoloL (BETIMOL) 0.5 % Drops Apply 1 drop every day each eye  use in affected eye(s).   Yes Emergency, Nurse, RN   FLUoxetine (PROZAC) 20 mg Capsule take 20 mg every day by mouth .   Yes Emergency, Nurse, RN   fluticasone propionate (FLOVENT) 110 mcg/actuation HFA Aerosol Inhaler take 2 puffs in the morning and 2 puffs before bedtime by inhalation.   Yes Emergency, Nurse, RN   triamterene-hydrochlorothiazide (DYAZIDE) 37.5-25 mg PO Cap take 1 Cap by mouth every day.    Yes Emergency, Nurse, RN   rosuvastatin (CRESTOR) 5 mg PO Tab take 10 mg by mouth every day.    Yes Emergency, Nurse, RN   albuterol (PROVENTIL) 2.5 mg /3 mL (0.083 %) Solution for Nebulization 2.5 mg three times daily as needed by Nebulization route  for Wheezing.  Patient not taking: Reported on 10/20/2022.    Emergency, Nurse, RN       Social History:  Social History     Tobacco Use   . Smoking status: Never   . Smokeless tobacco: Never   Substance Use Topics   . Alcohol use: No        Family History:  Family History   Problem Relation Name Age of Onset   . Hypertension Mother     . High Cholesterol Mother     . Diabetes Mother     . High Cholesterol Father     . Diabetes Father     . Stroke Father     . Cancer Father     . Diabetes Brother     . Stroke Brother         Physical  Exam    Constitutional: Oriented to person, place, and time. Appears frail an chronic I ill aalHead: Normocephalic and atraumatic.   Neck: Neck supple. Trachea midline.  No JVD  Cardiovascular: RRR  - No murmur heard.  Pulmonary/Chest: Effort normal and breath sounds normal. No respiratory distress. CTA  Abdominal: Obese. SNTND - normal BS. No masses. There is no rebound and no guarding.   Musculoskeletal: General: No edema. FROM  Neurological: Alert and oriented to person, place, and time. CN intact.  No focal des  Skin: Skin is warm and dry. Not diaphoretic.   Psychiatric: Normal mood and affect. Behavior is normal.     Nursing note and vitals reviewed.      Data Review:     CBC:   Lab Results   Component Value Date    WBC 9.5 10/20/2022    RBC 5.00 10/20/2022    HGB 13.5 10/20/2022    HCT 41.8 10/20/2022    PLT 295 10/20/2022     CBC w Diff:   Lab Results   Component Value Date    WBC 9.5 10/20/2022    HGB 13.5 10/20/2022    HCT 41.8 10/20/2022    PLT 295 10/20/2022    LYMPHOPCT 20.0 10/20/2022    MONOPCT 5.1 10/20/2022    EOSPCT 1.4 10/20/2022    BASOPCT 0.2 10/20/2022     BMP:   Lab Results   Component Value Date    GLU 121 (H) 10/20/2022    NA 144 10/20/2022    K 3.3 (L) 10/20/2022    CL 103 10/20/2022    CO2 33 (H) 10/20/2022    BUN 17 10/20/2022    CREATININE 1.03 (H) 10/20/2022    CA 9.7 10/20/2022     CMP:   Lab Results   Component Value Date    NA 144 10/20/2022    K 3.3 (L) 10/20/2022    CL 103 10/20/2022    CO2 33 (H) 10/20/2022    BUN 17 10/20/2022    CREATININE 1.03 (H) 10/20/2022    GLU 121 (H) 10/20/2022    PROT 7.8 10/20/2022    ALBUMIN 3.1 (L) 10/20/2022    CA 9.7 10/20/2022    BILITOT 0.4  10/20/2022    ALKPHOS 87 10/20/2022    AST 16 10/20/2022    ALT 23 10/20/2022    GLO 4.7 (H) 10/20/2022    MG 2.1 10/20/2022     Coagulation:   Lab Results   Component Value Date    PROTIME 10.5 10/20/2022    INR 1.0 10/20/2022    PTT 27.5 10/20/2022     Cardiac markers:   Lab Results   Component Value Date    TROPONINHS 6 10/20/2022     BNP:   Lab Results   Component Value Date    BNP 105.0 10/20/2022     Radiology review:   Chest X-Ray:   IMPRESSION:    No acute cardiopulmonary pathology.    Head CT  IMPRESSION: Normal intracranial examination.       CTA HEad and neck  IMPRESSION:  No hemodynamically significant stenosis within the head or neck. No evidence of intracranial aneurysm.    See       Old Chart reviewed    Assessment & Plan    TIA vs Atypical Migraine   Pt wit atypicl symptoms for both - tele neurology consulted from ED - see consult.  Work up negative to date.  Educated pt -= start  empiric daily ASA = focus on BP control = and PCP for lipids.  Already on a statin.  Labs and vitals otherwise stable.  Symptoms all resolved and pt feels better - back to sefl - without any acute issues or complaints.  Unable to get [facility] and MRI -  and complete work up as this is a weekend = also recommended by neurology was TEE bubble study.  Explained to pt.  She and husband seem very reliable.  Will allow to go home = educated on BP log, ;lipid check per PCP, follow up ECHO/TTE. MRI, and possible Neurology referral.  Pt understanding - does not wish to stay any longer = understanding of risks if was a TIA.  Educated on FAST.  Instructed to return to ED as needed      Tobacco Dependence:  Pt screened - not a current user.  Substance/ Alcohol Abuse:  Pt screened - not a current user.    DVT Prophylaxis: SCD's; medication monitoring of platelet count & H/H.  Stress Ulcer Prophylaxis: H2 Blocker    Advance Directive: CPR (ATTEMPT RESUSCITATION)    Care plan discussed with her; questions invited & answered.    The patient  is being admitted to the Hospitalist Service at [facility].       .    Physician/Midlevel Provider Discharge Summary  [facility]   Patient Name: Jennifer Tapia   Patient Age: 62 y.o.   Patient DOB: 1960-10-27   MRN: [id number]   PCP: Ellwood Dense   Admit date: 10/20/2022   Discharge date:  10/21/2022    Admitting Physician: Lestine Box, MD    Admission Diagnoses: Weakness due to acute cerebrovascular accident (CVA)  [facility]) [I63.9, R53.1]   Discharge Physician: Remus Blake, PA   Discharge Diagnoses:  Principal Problem:    Weakness due to acute cerebrovascular accident (CVA)       Consults:  none   Procedures: nonr   }    Pending Test Results     See chart - NEEDS SEVERAL OUTPT STUDIES PER PCP    Disposition     Patient discharged to: Home     Patient Instructions   Follow Up:  with  Ellwood Dense early next week for lab recheck - oupt orders for MRI, ECHO, and caroitds - lipids - and possible referral.  Pt educated on discharge meds nd instructed to retrurn to the ED for any new or worsening symptoms   Discharge Activity:  activity as tolerated     Discharge Diet:  Cardiac Diet and Low Fat, Low Cholesterol Diet     Special Instructions:  Call your primary care physician or return to the Emergency Department if you have new or worsening symptoms.  Call your primary care physician if you have any questions about or problems with your medications.   Discharge Medications:       Medication List      ASK your doctor about these medications    * albuterol 2.5 mg /3 mL (0.083 %) Nebu  Commonly known as: PROVENTIL  2.5 mg three times daily as needed by Nebulization route  for Wheezing.     * albuterol 90 mcg/actuation Hfaa  Commonly known as: PROVENTIL HFA  take 2 puffs every 4 hours as needed by inhalation .     FLUoxetine 20 mg Cap  Commonly known as: PROzac  take 20 mg every day by mouth .  fluticasone propionate 110 mcg/actuation Hfaa  Commonly known  as: FLOVENT  take 2 puffs in the morning and 2 puffs before bedtime by inhalation.     latanoprost 0.005 % Drop  Commonly known as: XALATAN  Apply 1 drop every evening each eye .     levothyroxine 88 mcg Tab  Commonly known as: SYNTHROID  take 88 mcg every day by mouth .     montelukast 10 mg Tab  Commonly known as: SINGULAIR  take 10 mg every day by mouth .     rosuvastatin 5 mg Tab  Commonly known as: CRESTOR  take 10 mg by mouth every day.     timoloL 0.5 % Drop  Commonly known as: BETIMOL  Apply 1 drop every day each eye  use in affected eye(s).     triamterene-hydrochlorothiazide 37.5-25 mg Cap  Commonly known as: DYAZIDE  take 1 Cap by mouth every day.         * This list has 2 medication(s) that are the same as other medications prescribed for you. Read the directions carefully, and ask your doctor or other care provider to review them with you.                 Total time in preparing paper work, data evaluation and todays exam 40 minutes  Seen and evaluated wotj Dr. Franchot Erichsen      Signed: Remus Blake, [place]  10/21/2022  10:29 AM

## 2022-10-21 NOTE — Progress Notes (Signed)
Printed discharge instructions, prescriptions, and follow up appointment information given to and reviewed with patient. Opportunity for question given and answers provided, patient verbalized understanding as well as the ability to comply with plan of care and repeated back the instructions given. Venous access removed intact, direct pressure applied, patient tolerated well. Patient was accompanied to the main lobby where transportation was waiting.

## 2022-10-23 ENCOUNTER — Other Ambulatory Visit: Payer: Self-pay

## 2022-10-24 ENCOUNTER — Other Ambulatory Visit (INDEPENDENT_AMBULATORY_CARE_PROVIDER_SITE_OTHER): Payer: Self-pay

## 2022-10-24 ENCOUNTER — Encounter (INDEPENDENT_AMBULATORY_CARE_PROVIDER_SITE_OTHER): Payer: Self-pay

## 2022-10-24 DIAGNOSIS — Z7189 Other specified counseling: Secondary | ICD-10-CM

## 2022-10-24 NOTE — Nursing Note (Signed)
Transition of Care Contact Information  Discharge Date: 10/21/2022  Transition Facility Type--Hospital (Inpatient or Observation)  [place]): Completed or attempted contact indicated by Date/Time  First Attempt Call: 10/24/2022  3:56 PM  Second Attempted Contact: 10/24/2022  3:56 PM  Contact Method(s)-- Patient/Caregiver Telephone, MyChart Patient Portal  Clinical Staff Name/Role who contacted--Jessie RN CTC  Transition Note:CTC attempted outreach via telephone for hospital discharge follow up with no avail. Voice mail left identifying myself as well as reason for call and my contact information. A MyChart message was also sent to patient with reasoning for outreach along with my contact information and the 24 hour nurse triage line telephone number. Final attempt for outreach will be completed on the next business day.      Unable to Complete TCM due to:  Further information may be documented in relevant telephone or outreach encounter.

## 2022-10-26 ENCOUNTER — Other Ambulatory Visit: Payer: Self-pay

## 2022-10-26 NOTE — Nursing Note (Signed)
Transition of Care Contact Information  Discharge Date: 10/21/2022  Transition Facility Type--Hospital (Inpatient or Observation)  [facility]): Completed or attempted contact indicated by Date/Time  First Attempt Call: 10/24/2022  3:56 PM  Second Attempted Contact: 10/24/2022  3:56 PM  Third Attempted Contact: 10/26/2022 12:32 PM  Contact Method(s)-- Patient/Caregiver Telephone, MyChart Patient Portal  Clinical Staff Name/Role who contacted--Jessie RN CTC  Transition Note:CTC attempted outreach via telephone for hospital discharge follow up with no avail. Voice mail left identifying myself as well as reason for call and my contact information. A MyChart message was also sent to patient with reasoning for outreach along with my contact information and the 24 hour nurse triage line telephone number. Final attempt for outreach will be completed within the next 1-2 business days.     10/26/22 12:32pm CTC attempted final outreach with no success. TOC closed as pt is unengaged / unable to reach.     Unable to Complete TCM due to:  Further information may be documented in relevant telephone or outreach encounter.

## 2022-10-30 ENCOUNTER — Other Ambulatory Visit: Payer: Self-pay

## 2022-10-30 ENCOUNTER — Encounter (INDEPENDENT_AMBULATORY_CARE_PROVIDER_SITE_OTHER): Payer: Self-pay

## 2022-10-31 ENCOUNTER — Other Ambulatory Visit: Payer: Self-pay

## 2022-11-01 ENCOUNTER — Other Ambulatory Visit: Payer: Self-pay

## 2022-11-02 ENCOUNTER — Other Ambulatory Visit: Payer: Self-pay

## 2022-11-07 ENCOUNTER — Other Ambulatory Visit (INDEPENDENT_AMBULATORY_CARE_PROVIDER_SITE_OTHER): Payer: Self-pay | Admitting: NURSE PRACTITIONER

## 2022-11-07 MED ORDER — AZITHROMYCIN 250 MG TABLET
ORAL_TABLET | ORAL | 0 refills | Status: DC
Start: 2022-11-07 — End: 2023-09-04

## 2022-11-14 ENCOUNTER — Other Ambulatory Visit: Payer: Self-pay

## 2022-11-16 ENCOUNTER — Other Ambulatory Visit: Payer: Self-pay

## 2022-11-21 ENCOUNTER — Other Ambulatory Visit: Payer: Self-pay

## 2022-11-22 ENCOUNTER — Other Ambulatory Visit: Payer: Self-pay

## 2022-12-26 ENCOUNTER — Other Ambulatory Visit: Payer: Self-pay

## 2022-12-26 IMAGING — MR MRI LUMBAR SPINE WITHOUT CONTRAST
6 series · 48 of 48 positions shown · IV contrast (gadolinium)
Comparison: None available.

﻿EXAM:  20233   MRI LUMBAR SPINE WITHOUT CONTRAST
INDICATION: 61-year-old with chronic low back pain radiating to both legs.  No history of back surgery.
TECHNIQUE: Multiplanar multisequential MRI of the lumbosacral spine was performed without gadolinium contrast.

[Series 4: T2 · coronal · 4.0mm · 1.61mm/px · 9 of 20 slices shown (1 of 3)]
[im 1/20]
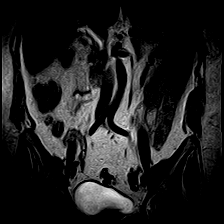
[im 3/20]
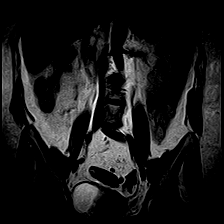
[im 5/20]
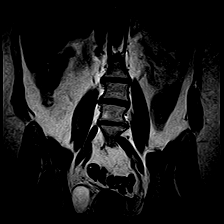
[im 8/20]
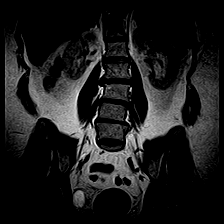
[im 10/20]
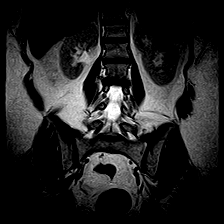
[im 12/20]
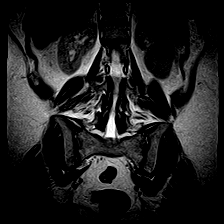
[im 15/20]
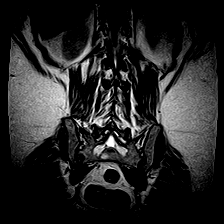
[im 17/20]
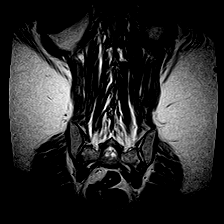
[im 20/20]
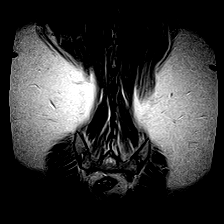

[Series 5: T2 · sagittal · 5.0mm · 1.00mm/px · 7 of 15 slices shown (2 of 3)]
[im 1/15]
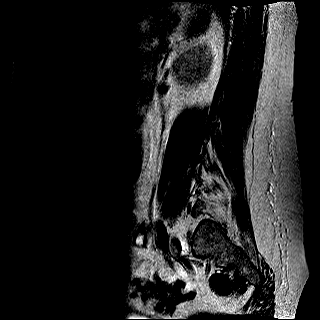
[im 3/15]
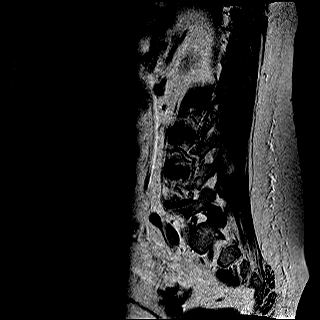
[im 5/15]
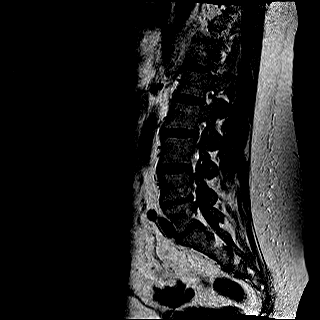
[im 8/15]
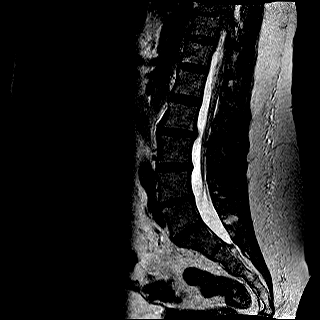
[im 10/15]
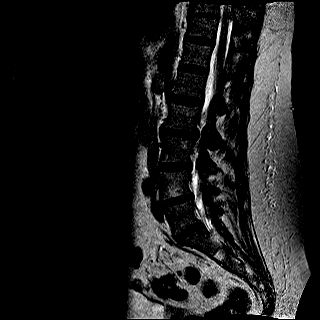
[im 12/15]
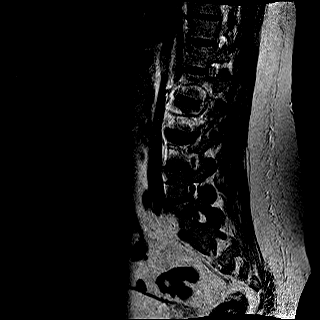
[im 15/15]
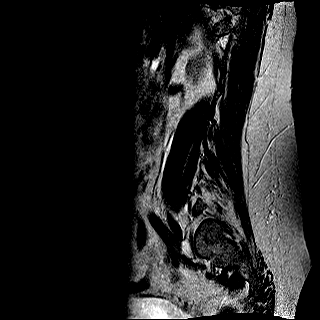

[Series 6: T1 · sagittal · 5.0mm · 1.00mm/px · 6 of 15 slices shown (1 of 2)]
[im 1/15]
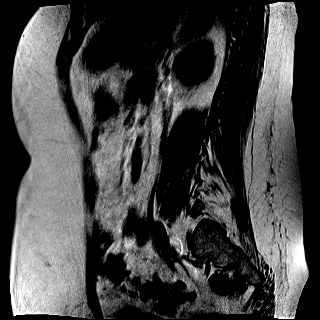
[im 3/15]
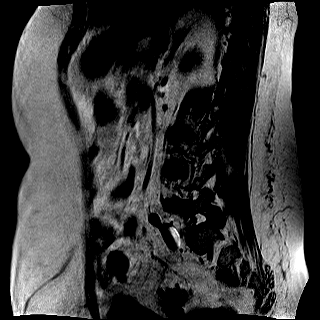
[im 6/15]
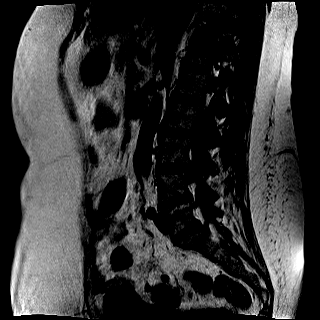
[im 9/15]
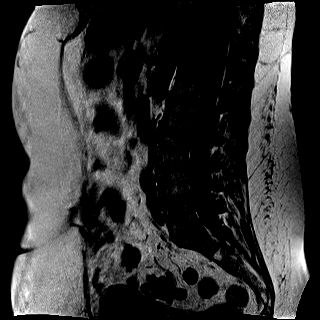
[im 12/15]
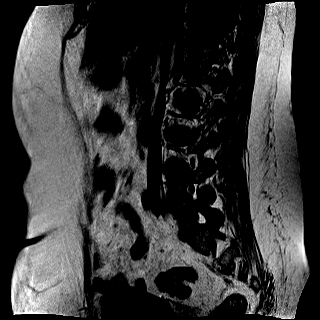
[im 15/15]
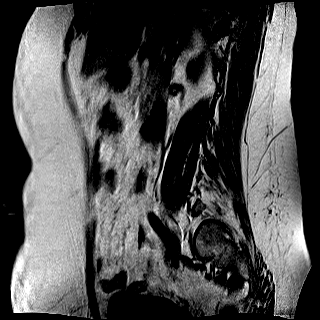

[Series 7: STIR · sagittal · 5.0mm · 1.25mm/px · 6 of 15 slices shown]
[im 1/15]
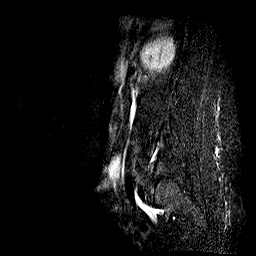
[im 3/15]
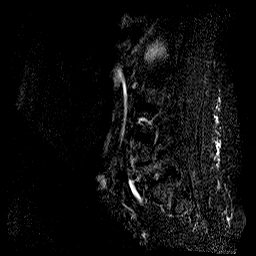
[im 6/15]
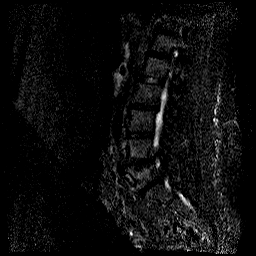
[im 9/15]
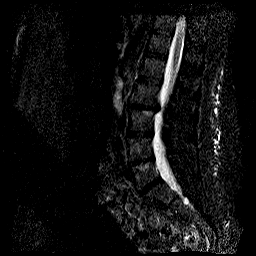
[im 12/15]
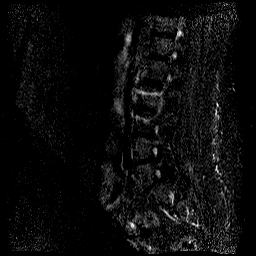
[im 15/15]
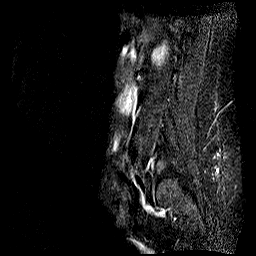

[Series 8: T2 · axial · 5.0mm · 0.89mm/px · z∈[-118,+85]mm · 10 of 25 slices shown (3 of 3)]
[im 1/25]
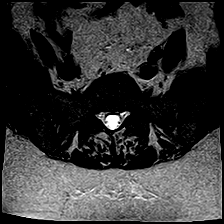
[im 3/25]
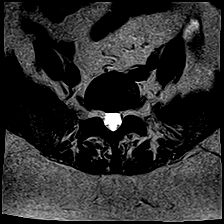
[im 6/25]
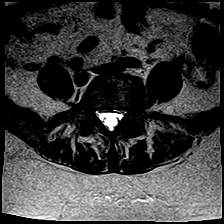
[im 9/25]
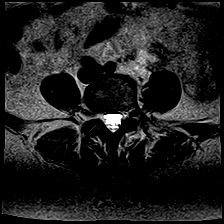
[im 11/25]
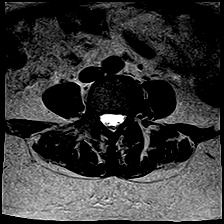
[im 14/25]
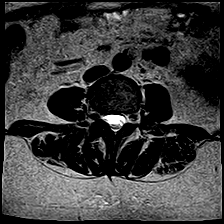
[im 17/25]
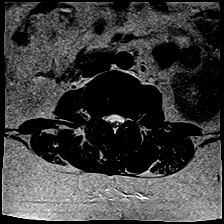
[im 19/25]
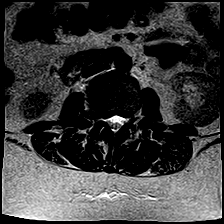
[im 22/25]
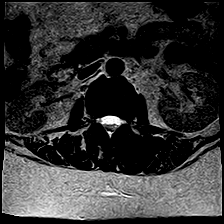
[im 25/25]
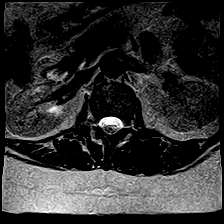

[Series 9: T1 · axial · 5.0mm · 0.89mm/px · z∈[-118,+85]mm · 10 of 25 slices shown (2 of 2)]
[im 1/25]
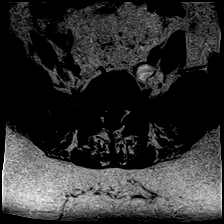
[im 3/25]
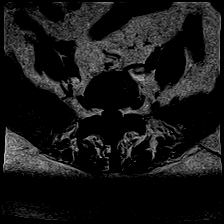
[im 6/25]
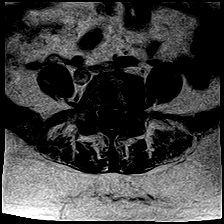
[im 9/25]
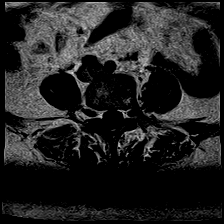
[im 11/25]
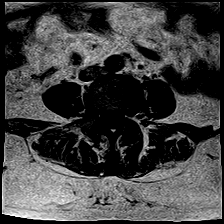
[im 14/25]
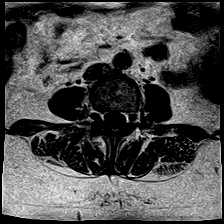
[im 17/25]
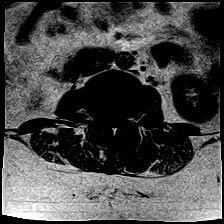
[im 19/25]
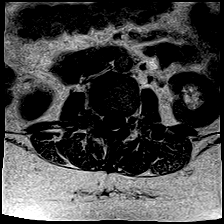
[im 22/25]
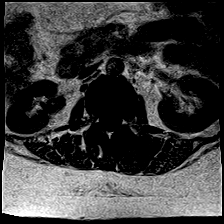
[im 25/25]
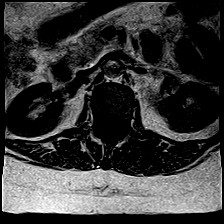

[48 of 48 positions shown; findings below may reference images not displayed]

FINDINGS: No acute or focal bone changes of lumbar vertebrae are seen.  Conus medullaris and cauda equina structures are normal in the sagittal projection.

At L1-2 level, no significant focal disc lesions are seen. 

At L2-3 level, significant degenerative disc disease with asymmetrically bulging annulus and osteophyte complex on the right side is causing significant compromise of right lateral recess and neural foramina.  Mild compromise of thecal sac and left lateral recess are noted.  AP diameter of thecal sac in the midline measures 12 mm.

At L3-4 level, degenerative disc disease and facet arthropathy are causing moderate compromise of right lateral recess and lesser degree of compromise of left lateral recess.

At L4-5 level, degenerative disc disease and facet arthropathy are causing mild compromise of both neural foramina. 

At L5-S1 level, no significant focal disc lesions are seen. 

Paravertebral soft tissues are unremarkable.
IMPRESSION: 1. No acute bony lesions of lumbar vertebrae.

2. At L2-3 level, significant degenerative disc disease with asymmetrically bulging annulus and osteophyte complex on the right side is causing significant compromise of right lateral recess and neural foramina.  Mild compromise of thecal sac and left lateral recess are noted.  AP diameter of thecal sac in the midline measures 12 mm.

3. Findings at other disc levels are described above in detail.

## 2022-12-27 ENCOUNTER — Other Ambulatory Visit: Payer: Self-pay

## 2022-12-28 ENCOUNTER — Other Ambulatory Visit: Payer: Self-pay

## 2022-12-28 MED ORDER — WEGOVY 0.25 MG/0.5 ML SUBCUTANEOUS PEN INJECTOR
PEN_INJECTOR | SUBCUTANEOUS | 5 refills | Status: AC
Start: 2022-12-27 — End: ?
  Filled 2022-12-28: qty 2, 28d supply, fill #0
  Filled 2023-02-19: qty 2, 56d supply, fill #1
  Filled 2023-03-01: qty 2, 28d supply, fill #1
  Filled 2023-03-22 – 2023-04-02 (×3): qty 2, 28d supply, fill #2
  Filled 2023-04-26 – 2023-05-04 (×3): qty 2, 28d supply, fill #3
  Filled 2023-05-24 – 2023-06-15 (×4): qty 2, 28d supply, fill #4

## 2022-12-29 ENCOUNTER — Other Ambulatory Visit: Payer: Self-pay

## 2023-01-04 ENCOUNTER — Other Ambulatory Visit: Payer: Self-pay

## 2023-01-09 ENCOUNTER — Other Ambulatory Visit: Payer: Self-pay

## 2023-01-10 ENCOUNTER — Other Ambulatory Visit: Payer: Self-pay

## 2023-01-11 ENCOUNTER — Other Ambulatory Visit: Payer: Self-pay

## 2023-01-11 MED ORDER — FLUTICASONE PROPIONATE 110 MCG/ACTUATION HFA AEROSOL INHALER
INHALATION_SPRAY | RESPIRATORY_TRACT | 5 refills | Status: DC
Start: 2023-01-11 — End: 2023-09-04
  Filled 2023-01-11: qty 12, 30d supply, fill #0
  Filled 2023-01-24: qty 12, 28d supply, fill #0

## 2023-01-12 ENCOUNTER — Other Ambulatory Visit: Payer: Self-pay

## 2023-01-15 ENCOUNTER — Other Ambulatory Visit: Payer: Self-pay

## 2023-01-15 ENCOUNTER — Other Ambulatory Visit (INDEPENDENT_AMBULATORY_CARE_PROVIDER_SITE_OTHER): Payer: Self-pay | Admitting: NURSE PRACTITIONER

## 2023-01-15 MED ORDER — SILVER SULFADIAZINE 1 % TOPICAL CREAM
TOPICAL_CREAM | Freq: Every day | CUTANEOUS | 1 refills | Status: AC
Start: 2023-01-15 — End: 2023-01-29

## 2023-01-16 ENCOUNTER — Other Ambulatory Visit: Payer: Self-pay

## 2023-01-16 MED ORDER — FLUTICASONE PROPIONATE 110 MCG/ACTUATION HFA AEROSOL INHALER
2.0000 | INHALATION_SPRAY | Freq: Two times a day (BID) | RESPIRATORY_TRACT | 5 refills | Status: AC
Start: 2023-01-16 — End: ?
  Filled 2023-01-16 – 2023-01-18 (×2): qty 12, 30d supply, fill #0

## 2023-01-17 ENCOUNTER — Other Ambulatory Visit: Payer: Self-pay

## 2023-01-17 MED ORDER — ARNUITY ELLIPTA 200 MCG/ACTUATION POWDER FOR INHALATION
DISK | RESPIRATORY_TRACT | 5 refills | Status: DC
Start: 2023-01-17 — End: 2023-09-04
  Filled 2023-01-17: qty 30, 30d supply, fill #0
  Filled 2023-03-15 – 2023-04-16 (×5): qty 30, 30d supply, fill #1
  Filled 2023-05-21: qty 30, 30d supply, fill #2
  Filled 2023-06-19 – 2023-06-20 (×3): qty 30, 30d supply, fill #3

## 2023-01-18 ENCOUNTER — Other Ambulatory Visit: Payer: Self-pay

## 2023-01-19 ENCOUNTER — Other Ambulatory Visit: Payer: Self-pay

## 2023-01-22 ENCOUNTER — Other Ambulatory Visit: Payer: Self-pay

## 2023-01-24 ENCOUNTER — Other Ambulatory Visit: Payer: Self-pay

## 2023-01-30 ENCOUNTER — Ambulatory Visit: Payer: Self-pay

## 2023-01-30 NOTE — Nursing Note (Signed)
PEAK CARE MANAGEMENT    Received warm transfer from member services.      Member states that she has always had back problems, specifically with her L3-5, T1-2, and S1.  Member has been following with her PCP and a chiropractor.  She has an appointment with her PCP later today.  Her PCP previously placed a referral for Neurology but member states that she could not get an appointment until February of next year.  Member states that the pain was tolerable last week.  She went to the chiropractor on Monday, Wednesday, and Thursday of last week.  After her appointment on Thursday with the chiropractor, she has been in a significant amount of pain.  She has almost fallen three times, she is crying in pain, missed work last week, is ambulating with a cane, and has numbness in her right leg.  The pain is affecting her ADLs and her work.      Chart review completed.  Confirmed that Neurology appointment is not scheduled until 09/04/2023.  Member has met TMOOP for benefit year 2024.    Provided member with a list of enhanced and standard Neurologists within a 25 mile radius of member's zip code and advised that since TMOOP for 2024 has been met, there is no difference in out of pocket cost of enhanced vs standard.  The TMOOP will reset 08/01/2023 and standard network providers will have a higher out of pocket cost.    I also advised member to discuss a pain management referral with her PCP at today's scheduled visit.  Member verbalized understanding.  Provided member with a list of in network pain management providers within a 50 mile radius of member's home zip code.    Home safety education discussed.    Educated member on when to seek immediate medical attention.    Provided member with direct contact information and encouraged to call with any questions, concerns, or needs.    Creta Levin, RN

## 2023-02-08 ENCOUNTER — Ambulatory Visit: Payer: Self-pay

## 2023-02-08 NOTE — Nursing Note (Signed)
PEAK CARE MANAGEMENT    Received call from member.      Member is still on short term disability and sees her PCP tomorrow.  She is in a significant amount of back pain which is affecting her ability to complete ADLs in a timely routine.    Member states that she did find a neurosurgeon to see her 03/04/24 - Dr. Marzella Schlein at [facility].  She is also going to be starting physical therapy on Monday at [facility].    After further research, notified member that Dr. Marzella Schlein and physical therapy at [facility] are out of network.    Provided member with number to [facility] neurosurgery department which is still about an hour and a half away from her home but they are in standard network.  She has tried all other in network (enhanced and standard) in her area and the soonest appointment she can get is Feb 2025.    Member is going to call [facility] on 02/08/23 to see if she can get a sooner appointment.  If she cannot, member is going to fill out a benefit exception form and submit to HR for Dr. Marzella Schlein.    Also provided member with several different in network physical therapy options and she is going to call them to get scheduled.    Confirmed member has direct contact information and encouraged to call with any questions, concerns, or needs.    Jennifer Levin, RN

## 2023-02-14 ENCOUNTER — Other Ambulatory Visit: Payer: Self-pay

## 2023-02-14 MED ORDER — ROSUVASTATIN 10 MG TABLET
ORAL_TABLET | ORAL | 5 refills | Status: DC
Start: 2023-02-14 — End: 2024-05-06
  Filled 2023-02-14: qty 90, 90d supply, fill #0
  Filled 2023-05-14 – 2023-06-14 (×4): qty 90, 90d supply, fill #1
  Filled 2023-09-13: qty 90, 90d supply, fill #2
  Filled 2024-01-09 – 2024-02-13 (×2): qty 90, 90d supply, fill #3

## 2023-02-15 ENCOUNTER — Other Ambulatory Visit: Payer: Self-pay

## 2023-02-19 ENCOUNTER — Other Ambulatory Visit: Payer: Self-pay

## 2023-02-20 ENCOUNTER — Other Ambulatory Visit: Payer: Self-pay

## 2023-02-21 ENCOUNTER — Other Ambulatory Visit: Payer: Self-pay

## 2023-02-21 MED ORDER — MONTELUKAST 10 MG TABLET
ORAL_TABLET | ORAL | 5 refills | Status: DC
Start: 2023-02-20 — End: 2024-03-05
  Filled 2023-02-21: qty 90, 90d supply, fill #0
  Filled 2023-05-21 – 2023-06-14 (×3): qty 90, 90d supply, fill #1
  Filled 2023-09-05: qty 90, 90d supply, fill #2
  Filled 2023-12-06: qty 90, 90d supply, fill #3

## 2023-02-21 MED ORDER — FLUOXETINE 20 MG CAPSULE
ORAL_CAPSULE | ORAL | 5 refills | Status: DC
Start: 2023-02-20 — End: 2024-03-05
  Filled 2023-02-21: qty 90, 90d supply, fill #0
  Filled 2023-05-21 – 2023-06-14 (×3): qty 90, 90d supply, fill #1
  Filled 2023-09-05: qty 90, 90d supply, fill #2
  Filled 2023-12-06: qty 90, 90d supply, fill #3

## 2023-02-21 MED ORDER — TRIAMTERENE 37.5 MG-HYDROCHLOROTHIAZIDE 25 MG CAPSULE
ORAL_CAPSULE | ORAL | 5 refills | Status: DC
Start: 2023-02-20 — End: 2024-05-06
  Filled 2023-02-21: qty 90, 90d supply, fill #0
  Filled 2023-05-21 – 2023-06-14 (×3): qty 90, 90d supply, fill #1
  Filled 2023-09-05: qty 90, 90d supply, fill #2
  Filled 2023-12-06 – 2024-02-13 (×2): qty 90, 90d supply, fill #3

## 2023-02-21 MED ORDER — LEVOTHYROXINE 88 MCG TABLET
ORAL_TABLET | ORAL | 5 refills | Status: AC
Start: 2023-02-20 — End: ?
  Filled 2023-02-21: qty 90, 90d supply, fill #0
  Filled 2023-05-21 – 2023-06-14 (×3): qty 90, 90d supply, fill #1
  Filled 2023-09-05: qty 90, 90d supply, fill #2
  Filled 2023-12-06: qty 90, 90d supply, fill #3

## 2023-02-22 ENCOUNTER — Other Ambulatory Visit: Payer: Self-pay

## 2023-02-23 ENCOUNTER — Other Ambulatory Visit: Payer: Self-pay

## 2023-02-25 ENCOUNTER — Other Ambulatory Visit: Payer: Self-pay

## 2023-02-26 ENCOUNTER — Other Ambulatory Visit: Payer: Self-pay

## 2023-03-01 ENCOUNTER — Other Ambulatory Visit: Payer: Self-pay

## 2023-03-02 ENCOUNTER — Other Ambulatory Visit: Payer: Self-pay

## 2023-03-07 ENCOUNTER — Other Ambulatory Visit: Payer: Self-pay

## 2023-03-08 ENCOUNTER — Other Ambulatory Visit: Payer: Self-pay

## 2023-03-08 MED ORDER — TIMOLOL MALEATE 0.5 % EYE DROPS
OPHTHALMIC | 3 refills | Status: AC
Start: 2023-03-08 — End: ?
  Filled 2023-03-08: qty 5, 50d supply, fill #0
  Filled 2023-04-24: qty 5, 50d supply, fill #1
  Filled 2023-05-24 – 2023-06-04 (×3): qty 5, 50d supply, fill #2
  Filled 2023-08-02 – 2023-09-13 (×2): qty 5, 50d supply, fill #3

## 2023-03-08 MED ORDER — LATANOPROST 0.005 % EYE DROPS
OPHTHALMIC | 3 refills | Status: AC
Start: 2023-03-08 — End: ?
  Filled 2023-03-08: qty 7.5, 75d supply, fill #0
  Filled 2023-03-18: qty 5, 50d supply, fill #0
  Filled 2023-05-24: qty 5, 50d supply, fill #1
  Filled 2023-12-11: qty 5, 50d supply, fill #2
  Filled 2024-01-14: qty 2.5, 25d supply, fill #3
  Filled 2024-02-13: qty 5, 50d supply, fill #3

## 2023-03-09 ENCOUNTER — Other Ambulatory Visit: Payer: Self-pay

## 2023-03-12 ENCOUNTER — Other Ambulatory Visit: Payer: Self-pay

## 2023-03-15 ENCOUNTER — Other Ambulatory Visit: Payer: Self-pay

## 2023-03-19 ENCOUNTER — Other Ambulatory Visit: Payer: Self-pay

## 2023-03-20 ENCOUNTER — Other Ambulatory Visit: Payer: Self-pay

## 2023-03-22 ENCOUNTER — Other Ambulatory Visit: Payer: Self-pay

## 2023-03-23 ENCOUNTER — Other Ambulatory Visit: Payer: Self-pay

## 2023-03-27 ENCOUNTER — Other Ambulatory Visit: Payer: Self-pay

## 2023-03-29 ENCOUNTER — Other Ambulatory Visit: Payer: Self-pay

## 2023-04-03 ENCOUNTER — Other Ambulatory Visit: Payer: Self-pay

## 2023-04-04 ENCOUNTER — Other Ambulatory Visit: Payer: Self-pay

## 2023-04-05 ENCOUNTER — Other Ambulatory Visit: Payer: Self-pay

## 2023-04-06 ENCOUNTER — Other Ambulatory Visit: Payer: Self-pay

## 2023-04-09 ENCOUNTER — Other Ambulatory Visit: Payer: Self-pay

## 2023-04-10 ENCOUNTER — Other Ambulatory Visit: Payer: Self-pay

## 2023-04-11 ENCOUNTER — Other Ambulatory Visit: Payer: Self-pay

## 2023-04-13 ENCOUNTER — Other Ambulatory Visit: Payer: Self-pay

## 2023-04-16 ENCOUNTER — Other Ambulatory Visit: Payer: Self-pay

## 2023-04-24 ENCOUNTER — Other Ambulatory Visit: Payer: Self-pay

## 2023-04-25 ENCOUNTER — Other Ambulatory Visit: Payer: Self-pay

## 2023-04-25 MED ORDER — TRIAMTERENE 37.5 MG-HYDROCHLOROTHIAZIDE 25 MG CAPSULE
1.0000 | ORAL_CAPSULE | Freq: Every day | ORAL | 5 refills | Status: DC
Start: 2023-04-23 — End: 2023-09-04
  Filled 2023-04-25 – 2023-04-27 (×2): qty 90, 90d supply, fill #0
  Filled 2023-07-23: qty 90, 90d supply, fill #1
  Filled ????-??-??: fill #0

## 2023-04-25 MED ORDER — LEVOTHYROXINE 88 MCG TABLET
88.0000 ug | ORAL_TABLET | Freq: Every day | ORAL | 5 refills | Status: DC
Start: 2023-04-23 — End: 2023-09-04
  Filled 2023-04-25 – 2023-04-27 (×2): qty 90, 90d supply, fill #0
  Filled 2023-07-23: qty 90, 90d supply, fill #1
  Filled ????-??-??: fill #0

## 2023-04-25 MED ORDER — MONTELUKAST 10 MG TABLET
10.0000 mg | ORAL_TABLET | Freq: Every evening | ORAL | 5 refills | Status: DC
Start: 2023-04-23 — End: 2023-09-04
  Filled 2023-04-25 – 2023-04-27 (×2): qty 90, 90d supply, fill #0
  Filled 2023-07-23: qty 90, 90d supply, fill #1
  Filled ????-??-??: fill #0

## 2023-04-25 MED ORDER — FLUOXETINE 20 MG CAPSULE
20.0000 mg | ORAL_CAPSULE | Freq: Every day | ORAL | 5 refills | Status: DC
Start: 2023-04-23 — End: 2023-09-04
  Filled 2023-04-25 – 2023-04-27 (×2): qty 90, 90d supply, fill #0
  Filled 2023-07-23: qty 90, 90d supply, fill #1
  Filled ????-??-??: fill #0

## 2023-04-25 MED ORDER — ROSUVASTATIN 10 MG TABLET
10.0000 mg | ORAL_TABLET | Freq: Every day | ORAL | 5 refills | Status: DC
Start: 2023-04-23 — End: 2023-09-04
  Filled 2023-04-25: qty 90, 90d supply, fill #0
  Filled 2023-07-19: qty 90, 90d supply, fill #1

## 2023-04-26 ENCOUNTER — Other Ambulatory Visit: Payer: Self-pay

## 2023-04-27 ENCOUNTER — Other Ambulatory Visit: Payer: Self-pay

## 2023-04-30 ENCOUNTER — Other Ambulatory Visit: Payer: Self-pay

## 2023-05-01 ENCOUNTER — Other Ambulatory Visit: Payer: Self-pay

## 2023-05-02 ENCOUNTER — Other Ambulatory Visit: Payer: Self-pay

## 2023-05-03 ENCOUNTER — Other Ambulatory Visit: Payer: Self-pay

## 2023-05-04 ENCOUNTER — Other Ambulatory Visit: Payer: Self-pay

## 2023-05-07 ENCOUNTER — Other Ambulatory Visit: Payer: Self-pay

## 2023-05-08 ENCOUNTER — Other Ambulatory Visit: Payer: Self-pay

## 2023-05-11 ENCOUNTER — Other Ambulatory Visit: Payer: Self-pay

## 2023-05-16 ENCOUNTER — Other Ambulatory Visit: Payer: Self-pay

## 2023-05-17 ENCOUNTER — Other Ambulatory Visit: Payer: Self-pay

## 2023-05-21 ENCOUNTER — Other Ambulatory Visit: Payer: Self-pay

## 2023-05-23 ENCOUNTER — Other Ambulatory Visit: Payer: Self-pay

## 2023-05-24 ENCOUNTER — Other Ambulatory Visit: Payer: Self-pay

## 2023-05-29 ENCOUNTER — Other Ambulatory Visit: Payer: Self-pay

## 2023-05-30 ENCOUNTER — Other Ambulatory Visit: Payer: Self-pay

## 2023-05-31 ENCOUNTER — Other Ambulatory Visit: Payer: Self-pay

## 2023-06-01 ENCOUNTER — Other Ambulatory Visit: Payer: Self-pay

## 2023-06-04 ENCOUNTER — Other Ambulatory Visit: Payer: Self-pay

## 2023-06-05 ENCOUNTER — Other Ambulatory Visit: Payer: Self-pay

## 2023-06-06 ENCOUNTER — Other Ambulatory Visit: Payer: Self-pay

## 2023-06-07 ENCOUNTER — Other Ambulatory Visit: Payer: Self-pay

## 2023-06-08 ENCOUNTER — Other Ambulatory Visit: Payer: Self-pay

## 2023-06-11 ENCOUNTER — Other Ambulatory Visit: Payer: Self-pay

## 2023-06-12 ENCOUNTER — Other Ambulatory Visit: Payer: Self-pay

## 2023-06-13 ENCOUNTER — Other Ambulatory Visit: Payer: Self-pay

## 2023-06-14 ENCOUNTER — Other Ambulatory Visit: Payer: Self-pay

## 2023-06-15 ENCOUNTER — Other Ambulatory Visit: Payer: Self-pay

## 2023-06-18 ENCOUNTER — Other Ambulatory Visit: Payer: Self-pay

## 2023-06-19 ENCOUNTER — Other Ambulatory Visit: Payer: Self-pay

## 2023-06-20 ENCOUNTER — Other Ambulatory Visit: Payer: Self-pay

## 2023-07-19 ENCOUNTER — Other Ambulatory Visit: Payer: Self-pay

## 2023-07-23 ENCOUNTER — Other Ambulatory Visit: Payer: Self-pay

## 2023-07-30 ENCOUNTER — Other Ambulatory Visit: Payer: Self-pay

## 2023-08-03 ENCOUNTER — Other Ambulatory Visit: Payer: Self-pay

## 2023-08-07 ENCOUNTER — Other Ambulatory Visit: Payer: Self-pay

## 2023-08-09 ENCOUNTER — Other Ambulatory Visit: Payer: Self-pay

## 2023-08-10 ENCOUNTER — Other Ambulatory Visit: Payer: Self-pay

## 2023-08-24 IMAGING — MR MRI LUMBAR SPINE WITHOUT CONTRAST
5 of 6 series · 32 of 48 positions shown · IV contrast (gadolinium)
Comparison: MRI dated 12/26/2022.

﻿EXAM:  [id number]   MRI LUMBAR SPINE WITHOUT CONTRAST
INDICATION: 62-year-old with persistent low back pain and radiculopathy starting in December.  Symptoms to both lower extremities. No prior surgery.
TECHNIQUE: Multiplanar, multisequential MRI of the lumbosacral spine was performed without gadolinium contrast.

[Series 5: T2 · sagittal · 4.0mm · 0.94mm/px · 6 of 14 slices shown (1 of 3)]
[im 1/14]
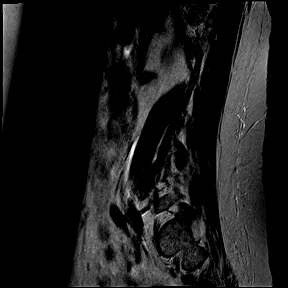
[im 3/14]
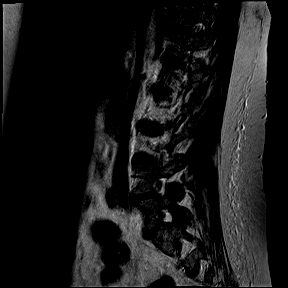
[im 6/14]
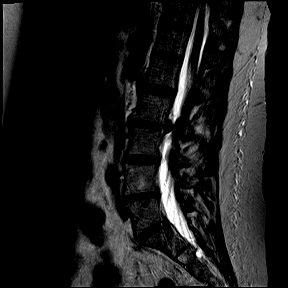
[im 8/14]
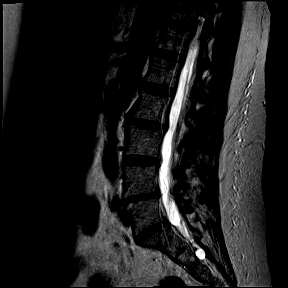
[im 11/14]
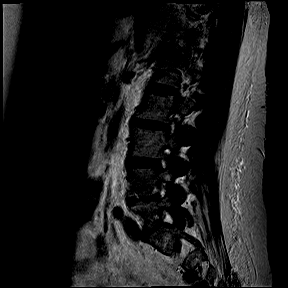
[im 14/14]
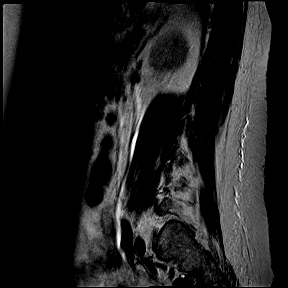

[Series 7: T1 · sagittal · 4.0mm · 0.94mm/px · 6 of 14 slices shown (1 of 2)]
[im 1/14]
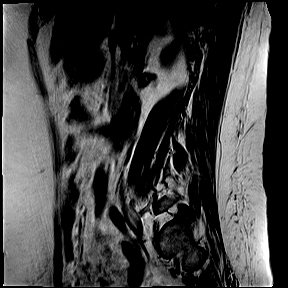
[im 3/14]
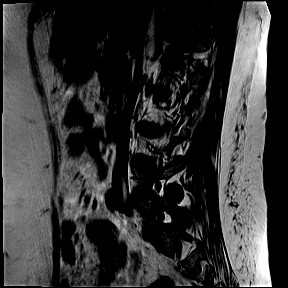
[im 6/14]
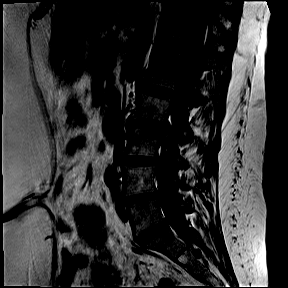
[im 8/14]
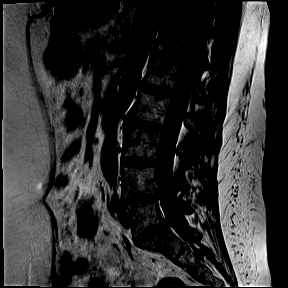
[im 11/14]
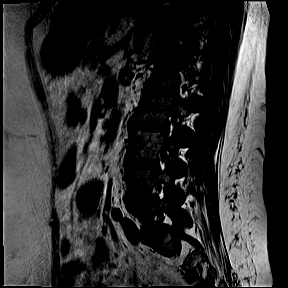
[im 14/14]
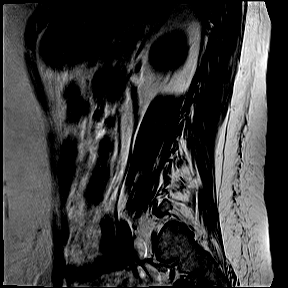

[Series 9: T2 · coronal · 5.0mm · 0.82mm/px · 8 of 18 slices shown (2 of 3)]
[im 1/18]
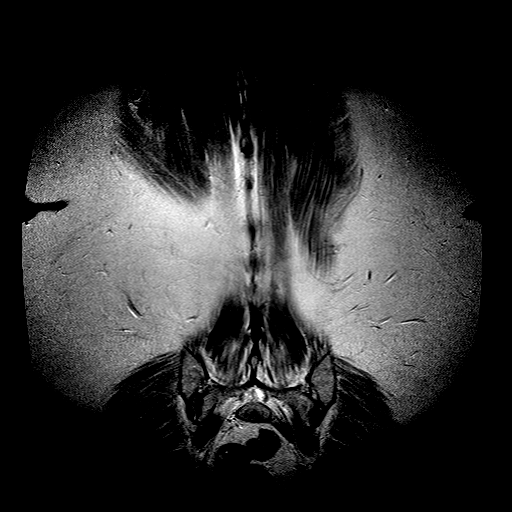
[im 3/18]
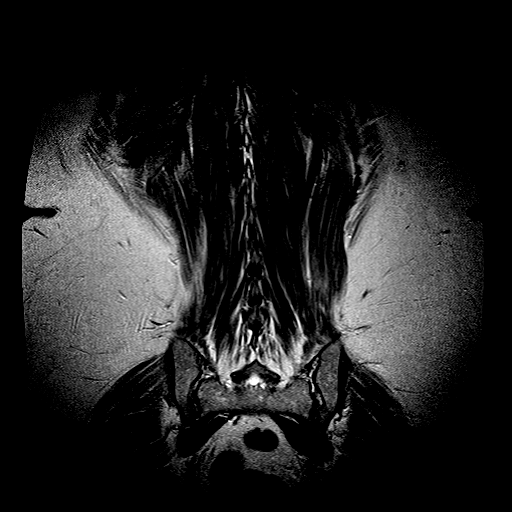
[im 5/18]
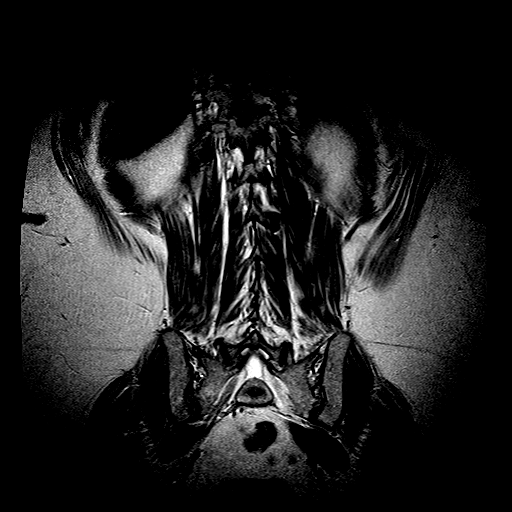
[im 8/18]
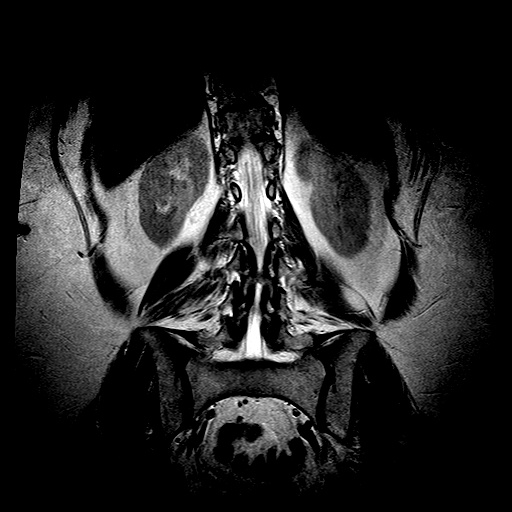
[im 10/18]
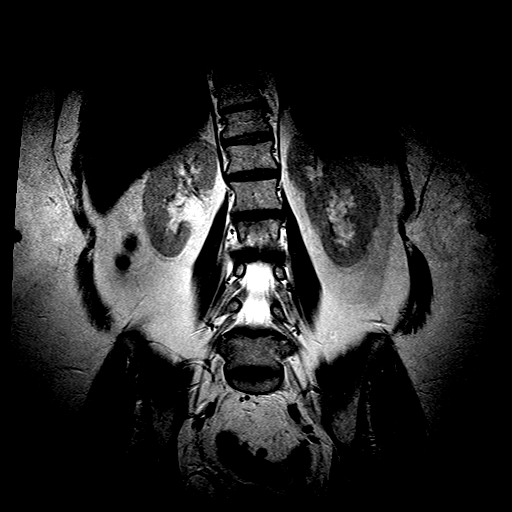
[im 13/18]
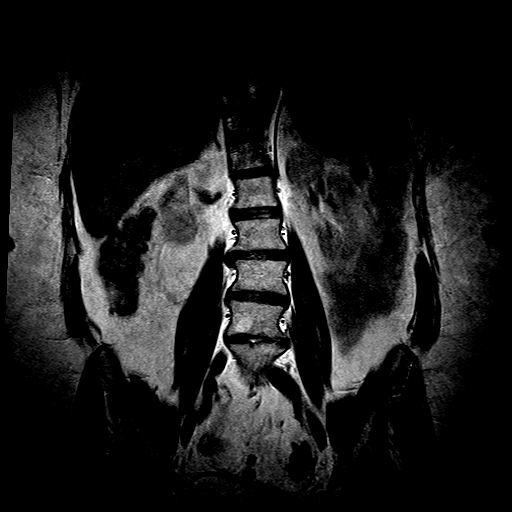
[im 15/18]
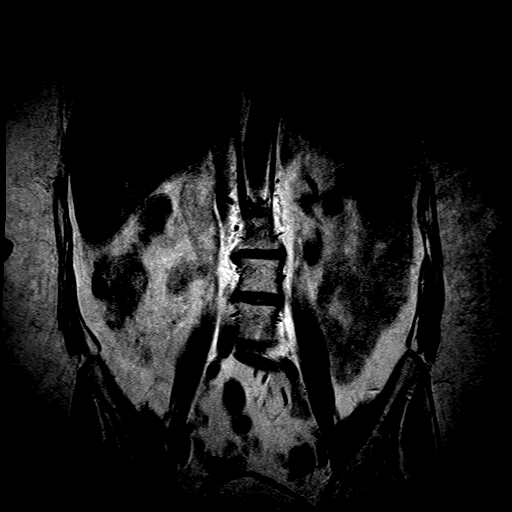
[im 18/18]
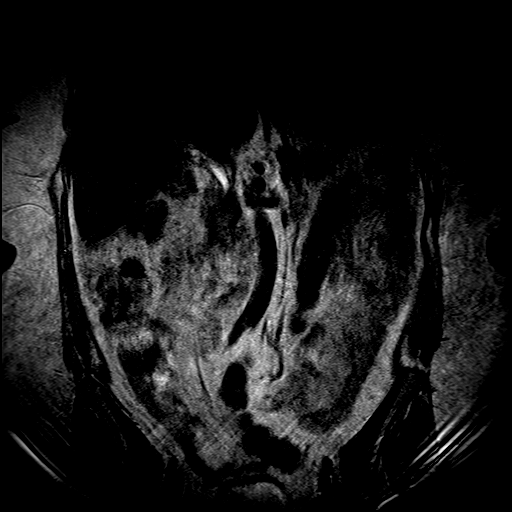

[Series 10: T2 · axial · 4.0mm · 0.52mm/px · z∈[-118,+111]mm · 11 of 24 slices shown (3 of 3)]
[im 1/24]
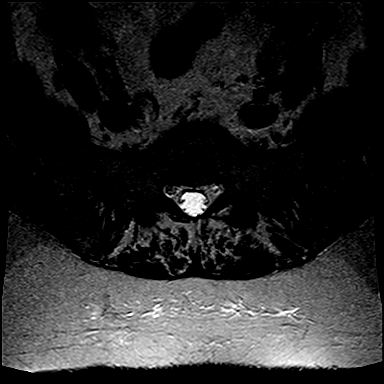
[im 3/24]
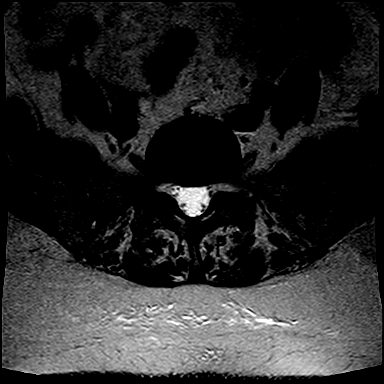
[im 5/24]
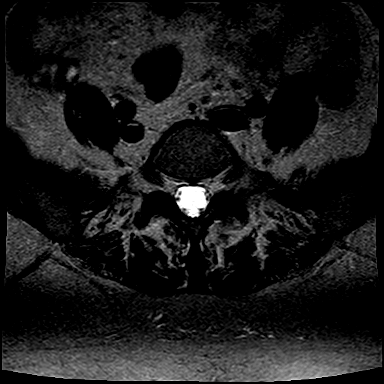
[im 7/24]
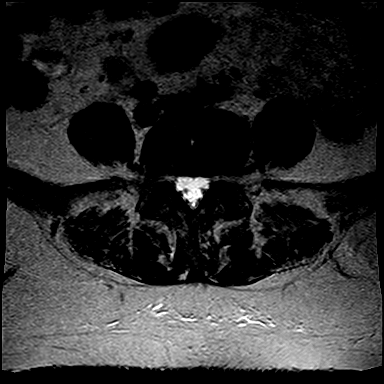
[im 10/24]
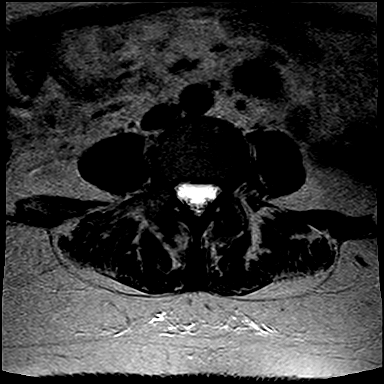
[im 12/24]
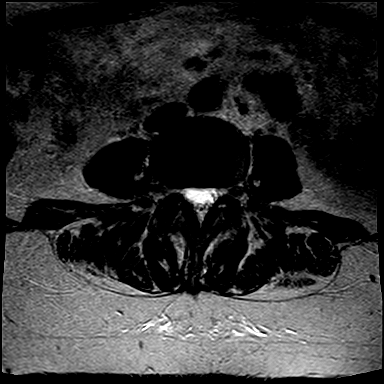
[im 14/24]
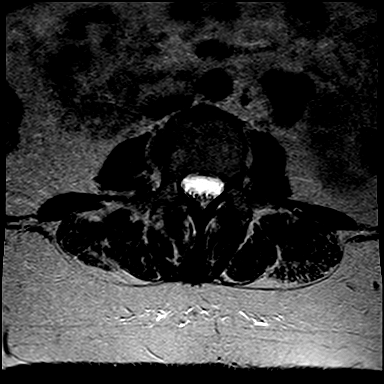
[im 17/24]
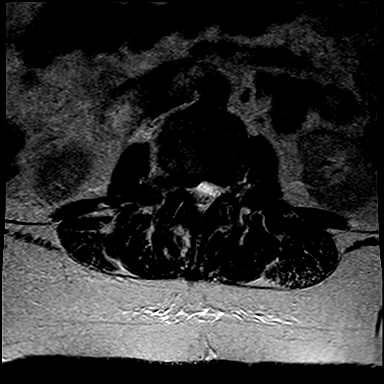
[im 19/24]
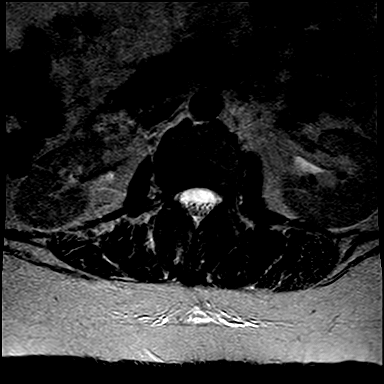
[im 21/24]
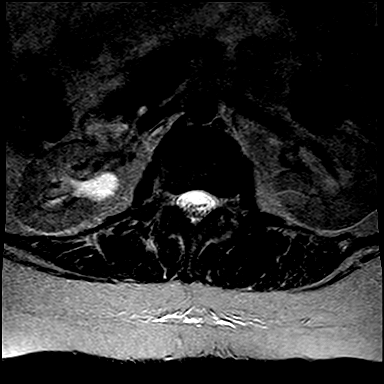
[im 24/24]
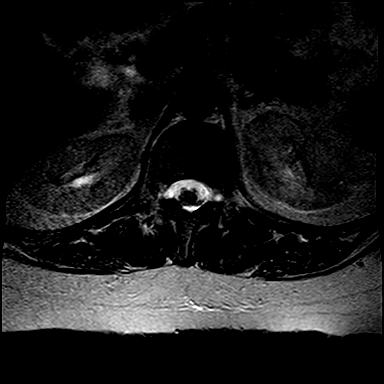

[Series 11: T1 · axial · 4.0mm · 0.52mm/px · 1 of 24 slices shown (2 of 2)]
[im 1/24]
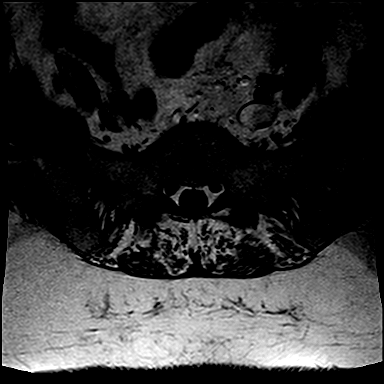

[32 of 48 positions shown; findings below may reference images not displayed]

FINDINGS: No acute bony lesions. Mild scoliosis with curvature convex to the left in the mid lumbar spine. 

Conus terminates at T12-L1 level.  L1-2 disc shows no focal abnormalities. 

At L2-3 level, significant asymmetric focal bulge and osteophyte complex on the right side are noted causing severe compromise of right lateral recess and neural foramen along with facet arthropathy.  This finding is essentially unchanged in appearance from previous examination.

At L3-4 level, degenerative disc disease and facet arthropathy with minimal compromise of lateral recess.  Similar finding at L4-5 level is noted.  

At L5-S1 level, degenerative disc changes are noted without compromise of thecal sac or neural foramina. 

Paravertebral soft tissues are unremarkable.
IMPRESSION: 1. No acute bony lesions of lumbar vertebrae. 

2. At L2-3 level, significant asymmetric focal bulge and osteophyte complex on the right side are noted causing severe compromise of right lateral recess and neural foramen along with facet arthropathy.  This finding is essentially unchanged in appearance from previous examination.

3. Findings at other levels are described above in detail and unchanged from prior study.

## 2023-09-04 ENCOUNTER — Ambulatory Visit (INDEPENDENT_AMBULATORY_CARE_PROVIDER_SITE_OTHER): Payer: Self-pay | Admitting: NEUROLOGY

## 2023-09-04 ENCOUNTER — Encounter (INDEPENDENT_AMBULATORY_CARE_PROVIDER_SITE_OTHER): Payer: Self-pay | Admitting: NEUROLOGY

## 2023-09-04 ENCOUNTER — Other Ambulatory Visit: Payer: Self-pay

## 2023-09-04 VITALS — BP 129/69 | HR 61 | Temp 99.1°F | Ht 63.0 in | Wt 228.0 lb

## 2023-09-04 DIAGNOSIS — G44229 Chronic tension-type headache, not intractable: Secondary | ICD-10-CM | POA: Insufficient documentation

## 2023-09-04 DIAGNOSIS — M5416 Radiculopathy, lumbar region: Secondary | ICD-10-CM | POA: Insufficient documentation

## 2023-09-04 MED ORDER — MELOXICAM 15 MG TABLET
15.0000 mg | ORAL_TABLET | Freq: Every day | ORAL | 2 refills | Status: AC
Start: 2023-09-04 — End: ?

## 2023-09-04 MED ORDER — GABAPENTIN 100 MG CAPSULE
100.0000 mg | ORAL_CAPSULE | Freq: Three times a day (TID) | ORAL | 2 refills | Status: DC
Start: 2023-09-04 — End: 2024-04-29

## 2023-09-04 NOTE — Progress Notes (Signed)
NEUROLOGY, [facility]  [address]  [place] [address]      ASSESSMENT/PLAN  Lumbar radiculopathy: chronic mid and lower back pain with right sciatic radiation, exacerbated by a sneezing episode in December. MRI shows a disc protrusion at L2-L3 with slight impingement on the left L2 nerve root. She has been treated with anti-inflammatories, muscle relaxers, and physical therapy, and is currently on Lyrica but experiences dizziness. She has not tried gabapentin and is scheduled for a lumbar injection tomorrow.  Stop Lyrica, begin gabapentin 100 mg TID, medication indications and adverse reactions reviewed with Patient and family; advised to stop medication and call the office with any mild to moderate side effects, and when to seek emergent medical attention.   Resume meloxicam 15 mg daily  Continue physical activity as tolerated; patient to decide if she wants to pursue PT again  Injections as per Dr. Georga Hacking (NS[facility])  MRI results from [facility] request  RTC 2 months  Chronic tension-type headache: 3-4/week. Possibly a component of MOH.  Meloxicam and gabapentin as above  Headache hygiene    Thank you for allowing me to participate in your patient's care and please do not hesitate to contact me for any questions or concerns.    On the day of the encounter, a total of 60 minutes was spent on this patient encounter including review of historical information, examination, documentation and post-visit activities. The time documented excludes procedural time.    Cecile Sheerer, MD  Assistant Professor of Neurology  [facility]    =====================================================================    NAME:  Jennifer Tapia  DOB:  08-May-1961  VISIT DATE:  09/04/2023     CC:  Pain    Patient seen in consultation at the request of Shrader, Colin Ina, *  History obtained from the patient and chart/records  Age of patient:  63 y.o.    I had  the pleasure of seeing Jennifer Tapia for outpatient consultation, who is a 63 y.o. year old female and is being seen for management of above CC.    HPI:    63 year old female presents with chronic back pain of several years' duration, experiencing pain in the middle and lower back with right sciatic radiation. Symptoms exacerbated in December following a sneezing episode. MRI findings include disc protrusion on the right at L2-L3 with slight impingement on the transiting left L2 nerve root. The patient has been undergoing treatment with anti-inflammatories, muscle relaxers, and physical therapy. She was prescribed Lyrica but has taken less due to experiencing dizziness as a side effect. The patient has not yet tried gabapentin, which was suggested as an alternative. She has been waiting 6-8 months for this appointment and is scheduled for an injection tomorrow, seeking advice on whether to proceed. The patient has been consulting with a neurosurgeon, Dr. Georga Hacking, who is providing her current plan of care but is considering the logistics of future visits due to distance.    =====================================================================  PMHx  Patient Active Problem List   Diagnosis    Skin lesion of back     Past Surgical History:   Procedure Laterality Date    HX APPENDECTOMY      HX CESAREAN SECTION  1984    HX CHOLECYSTECTOMY  1995    HX PARTIAL HYSTERECTOMY  1985    LEG SURGERY  2022    Cancer of right leg    THYROID SURGERY  04/30/2015  Family Medical History:       Problem Relation (Age of Onset)    Diabetes type II Mother    Heart Disease Mother    Hypertension (High Blood Pressure) Mother, Father    Lung Cancer Father    Stroke Father, Brother            Current Outpatient Medications   Medication Sig Dispense Refill    azithromycin (ZITHROMAX) 250 mg Oral Tablet Take 500 mg (2 tab) on day 1; take 250 mg (1 tab) on days 2-5. 6 Tablet 0    budesonide-glycopyr-formoterol (BREZTRI AEROSPHERE) 160-9-4.8  mcg/actuation Inhalation HFA Aerosol Inhaler Take 2 Puffs by inhalation Twice per day as needed      cyclobenzaprine (FLEXERIL) 10 mg Oral Tablet Take 1 Tablet (10 mg total) by mouth Three times a day as needed for Muscle spasms 12 Tablet 0    famotidine (PEPCID) 40 mg Oral Tablet Take 1 tablet (40 mg) by mouth once daily 90 Tablet 5    FLUoxetine (PROZAC) 20 mg Oral Capsule Take 1 Capsule (20 mg total) by mouth Once a day      FLUoxetine (PROZAC) 20 mg Oral Capsule Take 1 capsule (20mg ) by mouth once daily 90 Capsule 5    FLUoxetine (PROZAC) 20 mg Oral Capsule Take 1 capsule by mouth once daily 90 Capsule 5    FLUoxetine (PROZAC) 20 mg Oral Capsule Take 1 Capsule (20 mg total) by mouth Once a day 90 Capsule 5    fluticasone furoate (ARNUITY ELLIPTA) 200 mcg/actuation Inhalation Disk with Device Inhale 1 puff by mouth once daily. 30 Each 5    fluticasone propionate (FLOVENT HFA) 110 mcg/actuation Inhalation HFA Aerosol Inhaler oral inhaler Inhale 2 puffs by mouth twice daily 12 g 5    fluticasone propionate (FLOVENT HFA) 110 mcg/actuation Inhalation HFA Aerosol Inhaler oral inhaler Inhale 2 puffs by mouth twice daily 12 g 5    fluticasone propionate (FLOVENT) 110 mcg/actuation Inhalation HFA Aerosol Inhaler oral inhaler Take 2 Puffs by inhalation Twice daily      fluticasone propionate (FLOVENT) 110 mcg/actuation Inhalation HFA Aerosol Inhaler oral inhaler Take 2 Puffs by inhalation Twice daily 12 g 5    latanoprost (XALATAN) 0.005 % Ophthalmic Drops Instill 1 Drop into both eyes Every night      latanoprost (XALATAN) 0.005 % Ophthalmic Drops Instill 1 Drop into both eyes Every evening 7.5 mL 4    latanoprost (XALATAN) 0.005 % Ophthalmic Drops Instill 1 drop into affected eye(s) once a day in the evening 15 mL 0    latanoprost (XALATAN) 0.005 % Ophthalmic Drops Instill 1 drop into affected eye(s)  once daily in the evening 15 mL 3    levothyroxine (SYNTHROID) 88 mcg Oral Tablet Take 1 Tablet (88 mcg total) by mouth  Once a day      levothyroxine (SYNTHROID) 88 mcg Oral Tablet Take 1 tablet (88 mcg) by mouth once daily 90 Tablet 5    levothyroxine (SYNTHROID) 88 mcg Oral Tablet Take 1 tablet by mouth once daily 90 Tablet 5    levothyroxine (SYNTHROID) 88 mcg Oral Tablet Take 1 Tablet (88 mcg total) by mouth Once a day 90 Tablet 5    Methylprednisolone (MEDROL DOSEPACK) 4 mg Oral Tablets, Dose Pack Take as instructed. 21 Tablet 0    montelukast (SINGULAIR) 10 mg Oral Tablet Take 1 Tablet (10 mg total) by mouth Once a day      montelukast (SINGULAIR) 10 mg Oral Tablet Take 1 tablet (  10 mg) by mouth every night at bedtime 90 Tablet 5    montelukast (SINGULAIR) 10 mg Oral Tablet Take 1 tablet by mouth once at bedtime 90 Tablet 5    montelukast (SINGULAIR) 10 mg Oral Tablet Take 1 tablet (10 mg total) by mouth every night at bedtime. 90 Tablet 5    rosuvastatin (CRESTOR) 10 mg Oral Tablet Take 1 tablet (10mg  total) by mouth once a day 90 Tablet 5    rosuvastatin (CRESTOR) 10 mg Oral Tablet Take 1 Tablet (10 mg total) by mouth Once a day 90 Tablet 5    semaglutide, weight loss, (WEGOVY) 0.25 mg/0.5 mL Subcutaneous Pen Injector Inject 1 pen (0.25 mg) subcutaneously (under the skin) once every 7 days 2 mL 5    timolol maleate (ISTALOL) 0.5 % Ophthalmic Drops, Once Daily Instill 1 Drop into both eyes Every morning 7.5 mL 4    timoloL maleate (TIMOPTIC) 0.5 % Ophthalmic Drops Instill 1 Drop into both eyes Once a day      timoloL maleate (TIMOPTIC) 0.5 % Ophthalmic Drops Instill 1 drop into both eyes in the morning 5 mL 3    timoloL maleate (TIMOPTIC) 0.5 % Ophthalmic Drops Instill 1 drop into both eyes in the morning 5 mL 3    triamterene-hydroCHLOROthiazide (DYAZIDE) 37.5-25 mg Oral Capsule Take 1 Capsule by mouth Once a day      triamterene-hydroCHLOROthiazide (DYAZIDE) 37.5-25 mg Oral Capsule Take 1 capsule by mouth once daily 90 Capsule 5    triamterene-hydroCHLOROthiazide (DYAZIDE) 37.5-25 mg Oral Capsule Take 1 capsule by mouth once  daily 90 Capsule 5    triamterene-hydroCHLOROthiazide (DYAZIDE) 37.5-25 mg Oral Capsule Take 1 Capsule by mouth Once a day 90 Capsule 5     No current facility-administered medications for this visit.     Allergies   Allergen Reactions    Doxycycline Nausea/ Vomiting    Amoxicillin      itching, rash    Lansoprazole      rash    Pantoprazole      rash    Sulfa (Sulfonamides)      rash    Avelox [Moxifloxacin] Itching     Social History     Socioeconomic History    Marital status: Married     Spouse name: John    Number of children: 2    Years of education: 12    Highest education level: Not on file   Occupational History    Not on file   Tobacco Use    Smoking status: Never    Smokeless tobacco: Never   Vaping Use    Vaping status: Never Used   Substance and Sexual Activity    Alcohol use: Never    Drug use: Never    Sexual activity: Not Currently     Partners: Male   Other Topics Concern    Not on file   Social History Narrative    Not on file     Social Determinants of Health     Financial Resource Strain: Low Risk  (10/18/2021)    Financial Resource Strain     SDOH Financial: No   Transportation Needs: Low Risk  (10/18/2021)    Transportation Needs     SDOH Transportation: No   Social Connections: Low Risk  (10/18/2021)    Social Connections     SDOH Social Isolation: 5 or more times a week   Intimate Partner Violence: Low Risk  (10/18/2021)    Intimate Partner Violence  SDOH Domestic Violence: No   Housing Stability: Low Risk  (10/18/2021)    Housing Stability     SDOH Housing Situation: I have housing.     SDOH Housing Worry: No       =====================================================================  GENERAL EXAMINATION  There were no vitals taken for this visit.      Vital signs personally reviewed    General: No acute distress, alert  HEENT: Normocephalic, no scleral icterus  Pulmonary: No accessory muscle use, no tachypnea  Cardiovascular: Heart with regular rate & rhythm  Extremities: No significant  edema, No cyanosis    NEUROLOGIC EXAM  MENTAL STATUS: alert and oriented to person/place/time/situation; speech clear and fluent with good repetition and comprehension; naming intact; attention/concentration normal; recent and remote memory intact with normal fund of knowledge; able to follow simple and complex axial and appendicular commands without L/R confusion.    CN  II: not directly tested, grossly intact  III, IV, VI: extraocular movements intact without nystagmus  V: intact to light touch  VII: face symmetric without weakness  VIII: grossly intact  IX, X: symmetric palatal elevation  XI: normal strength of trapezius and sternocleidomastoid bilaterally  XII: tongue midline with full movements    MOTOR  Bulk: normal  Tone: normal  Abnormal Movements: none  Fasciculations: none    Strength: Patient moving all extremities symmetrically and against gravity with good strength on confrontation with some give-way weakness of RLE.    Reflexes: 2+ throughout without spread or pathologic findings    Sensory: Intact to Light Touch     Coordination: No dysmetria    Gait: Slightly antalgic upon standing, normal gait    =====================================================================  DATA  Personal review of prior labs is notable for:   No results found for: "VITB12", "FOLATE", "VITD25", "VITD", "LDLCHOL", "HA1C", "TSH", "FREET4"  Personal review of imaging (with independent interpretation) is notable for: Report of lumbar disc protrusion at L2-L3  Personal Review of other prior diagnostics is notable for: not available for review   =====================================================================    Orders  Orders Placed This Encounter    meloxicam (MOBIC) 15 mg Oral Tablet    gabapentin (NEURONTIN) 100 mg Oral Capsule

## 2023-09-05 ENCOUNTER — Other Ambulatory Visit: Payer: Self-pay

## 2023-09-10 ENCOUNTER — Other Ambulatory Visit: Payer: Self-pay

## 2023-09-13 ENCOUNTER — Other Ambulatory Visit: Payer: Self-pay

## 2023-09-14 ENCOUNTER — Other Ambulatory Visit: Payer: Self-pay

## 2023-09-18 ENCOUNTER — Ambulatory Visit (INDEPENDENT_AMBULATORY_CARE_PROVIDER_SITE_OTHER): Payer: Self-pay | Admitting: NEUROLOGY

## 2023-12-06 ENCOUNTER — Other Ambulatory Visit: Payer: Self-pay

## 2023-12-06 ENCOUNTER — Encounter (INDEPENDENT_AMBULATORY_CARE_PROVIDER_SITE_OTHER): Payer: Self-pay | Admitting: NEUROLOGY

## 2023-12-11 ENCOUNTER — Other Ambulatory Visit: Payer: Self-pay

## 2023-12-12 ENCOUNTER — Other Ambulatory Visit: Payer: Self-pay

## 2023-12-16 ENCOUNTER — Other Ambulatory Visit: Payer: Self-pay

## 2023-12-19 ENCOUNTER — Other Ambulatory Visit: Payer: Self-pay

## 2023-12-21 ENCOUNTER — Other Ambulatory Visit: Payer: Self-pay

## 2023-12-24 ENCOUNTER — Other Ambulatory Visit: Payer: Self-pay

## 2023-12-25 ENCOUNTER — Other Ambulatory Visit: Payer: Self-pay

## 2023-12-25 MED ORDER — WEGOVY 0.25 MG/0.5 ML SUBCUTANEOUS PEN INJECTOR
0.2500 mg | PEN_INJECTOR | SUBCUTANEOUS | 5 refills | Status: AC
  Filled 2023-12-25: qty 2, 28d supply, fill #0
  Filled 2024-05-06: qty 6, 84d supply, fill #0

## 2023-12-26 ENCOUNTER — Other Ambulatory Visit: Payer: Self-pay

## 2023-12-27 ENCOUNTER — Other Ambulatory Visit: Payer: Self-pay

## 2023-12-30 ENCOUNTER — Other Ambulatory Visit: Payer: Self-pay

## 2023-12-31 ENCOUNTER — Other Ambulatory Visit: Payer: Self-pay

## 2024-01-01 ENCOUNTER — Other Ambulatory Visit: Payer: Self-pay

## 2024-01-02 ENCOUNTER — Other Ambulatory Visit: Payer: Self-pay

## 2024-01-03 ENCOUNTER — Other Ambulatory Visit: Payer: Self-pay

## 2024-01-04 ENCOUNTER — Other Ambulatory Visit: Payer: Self-pay

## 2024-01-07 ENCOUNTER — Other Ambulatory Visit: Payer: Self-pay

## 2024-01-08 ENCOUNTER — Other Ambulatory Visit: Payer: Self-pay

## 2024-01-09 ENCOUNTER — Other Ambulatory Visit: Payer: Self-pay

## 2024-01-10 ENCOUNTER — Other Ambulatory Visit: Payer: Self-pay

## 2024-01-11 ENCOUNTER — Other Ambulatory Visit: Payer: Self-pay

## 2024-01-14 ENCOUNTER — Other Ambulatory Visit: Payer: Self-pay

## 2024-01-16 ENCOUNTER — Other Ambulatory Visit: Payer: Self-pay

## 2024-01-17 ENCOUNTER — Other Ambulatory Visit: Payer: Self-pay

## 2024-01-23 ENCOUNTER — Other Ambulatory Visit: Payer: Self-pay

## 2024-01-24 ENCOUNTER — Other Ambulatory Visit: Payer: Self-pay

## 2024-01-29 ENCOUNTER — Encounter (INDEPENDENT_AMBULATORY_CARE_PROVIDER_SITE_OTHER): Payer: Self-pay | Admitting: NEUROLOGY

## 2024-01-30 ENCOUNTER — Other Ambulatory Visit: Payer: Self-pay

## 2024-01-31 ENCOUNTER — Other Ambulatory Visit: Payer: Self-pay

## 2024-02-06 ENCOUNTER — Other Ambulatory Visit: Payer: Self-pay

## 2024-02-13 ENCOUNTER — Other Ambulatory Visit: Payer: Self-pay

## 2024-02-19 ENCOUNTER — Other Ambulatory Visit: Payer: Self-pay

## 2024-03-05 ENCOUNTER — Other Ambulatory Visit: Payer: Self-pay

## 2024-03-05 MED ORDER — FLUOXETINE 20 MG CAPSULE
ORAL_CAPSULE | ORAL | 5 refills | Status: AC
  Filled 2024-03-07 – 2024-08-28 (×3): qty 90, 90d supply, fill #0

## 2024-03-05 MED ORDER — MONTELUKAST 10 MG TABLET
ORAL_TABLET | ORAL | 5 refills | Status: AC
  Filled 2024-03-07 – 2024-08-28 (×3): qty 90, 90d supply, fill #0

## 2024-03-07 ENCOUNTER — Other Ambulatory Visit: Payer: Self-pay

## 2024-03-10 ENCOUNTER — Other Ambulatory Visit: Payer: Self-pay

## 2024-03-11 ENCOUNTER — Other Ambulatory Visit: Payer: Self-pay

## 2024-03-11 MED ORDER — LEVOTHYROXINE 88 MCG TABLET
ORAL_TABLET | ORAL | 5 refills | Status: AC
Start: 2024-03-11 — End: ?
  Filled 2024-03-11: qty 90, 90d supply, fill #0
  Filled 2024-08-28: qty 90, 90d supply, fill #1

## 2024-03-13 ENCOUNTER — Other Ambulatory Visit: Payer: Self-pay

## 2024-03-18 ENCOUNTER — Other Ambulatory Visit: Payer: Self-pay

## 2024-03-21 ENCOUNTER — Other Ambulatory Visit: Payer: Self-pay

## 2024-05-06 ENCOUNTER — Other Ambulatory Visit: Payer: Self-pay

## 2024-05-06 MED ORDER — ROSUVASTATIN 10 MG TABLET
10.0000 mg | ORAL_TABLET | Freq: Every day | ORAL | 4 refills | Status: AC
  Filled 2024-05-06 – 2024-08-28 (×2): qty 90, 90d supply, fill #0

## 2024-05-06 MED ORDER — TRIAMTERENE 37.5 MG-HYDROCHLOROTHIAZIDE 25 MG CAPSULE
1.0000 | ORAL_CAPSULE | Freq: Every day | ORAL | 4 refills | Status: AC
  Filled 2024-05-06 – 2024-08-28 (×2): qty 90, 90d supply, fill #0

## 2024-05-07 ENCOUNTER — Other Ambulatory Visit: Payer: Self-pay

## 2024-05-08 ENCOUNTER — Other Ambulatory Visit: Payer: Self-pay

## 2024-05-09 ENCOUNTER — Other Ambulatory Visit: Payer: Self-pay

## 2024-05-12 ENCOUNTER — Other Ambulatory Visit: Payer: Self-pay

## 2024-05-16 ENCOUNTER — Other Ambulatory Visit: Payer: Self-pay

## 2024-05-19 ENCOUNTER — Other Ambulatory Visit: Payer: Self-pay

## 2024-05-20 ENCOUNTER — Other Ambulatory Visit: Payer: Self-pay

## 2024-05-26 ENCOUNTER — Other Ambulatory Visit: Payer: Self-pay

## 2024-05-27 ENCOUNTER — Other Ambulatory Visit: Payer: Self-pay

## 2024-08-28 ENCOUNTER — Other Ambulatory Visit: Payer: Self-pay

## 2024-08-29 ENCOUNTER — Other Ambulatory Visit: Payer: Self-pay
